# Patient Record
Sex: Female | Born: 1985
Health system: Southern US, Community
[De-identification: ages and names within clinical notes are randomized; demographics above are authoritative.]

## PROBLEM LIST (undated history)

## (undated) DIAGNOSIS — T8859XA Other complications of anesthesia, initial encounter: Secondary | ICD-10-CM

## (undated) DIAGNOSIS — I10 Essential (primary) hypertension: Secondary | ICD-10-CM

## (undated) DIAGNOSIS — K5900 Constipation, unspecified: Secondary | ICD-10-CM

## (undated) DIAGNOSIS — O24419 Gestational diabetes mellitus in pregnancy, unspecified control: Secondary | ICD-10-CM

## (undated) DIAGNOSIS — F419 Anxiety disorder, unspecified: Secondary | ICD-10-CM

## (undated) DIAGNOSIS — F329 Major depressive disorder, single episode, unspecified: Secondary | ICD-10-CM

## (undated) DIAGNOSIS — R87629 Unspecified abnormal cytological findings in specimens from vagina: Secondary | ICD-10-CM

## (undated) DIAGNOSIS — R58 Hemorrhage, not elsewhere classified: Secondary | ICD-10-CM

## (undated) DIAGNOSIS — F32A Depression, unspecified: Secondary | ICD-10-CM

## (undated) DIAGNOSIS — T4145XA Adverse effect of unspecified anesthetic, initial encounter: Secondary | ICD-10-CM

## (undated) DIAGNOSIS — O09299 Supervision of pregnancy with other poor reproductive or obstetric history, unspecified trimester: Secondary | ICD-10-CM

## (undated) HISTORY — DX: Depression, unspecified: F32.A

## (undated) HISTORY — DX: Anxiety disorder, unspecified: F41.9

## (undated) HISTORY — DX: Hemorrhage, not elsewhere classified: R58

## (undated) HISTORY — DX: Major depressive disorder, single episode, unspecified: F32.9

## (undated) HISTORY — DX: Essential (primary) hypertension: I10

## (undated) HISTORY — DX: Gestational diabetes mellitus in pregnancy, unspecified control: O24.419

## (undated) HISTORY — DX: Unspecified abnormal cytological findings in specimens from vagina: R87.629

## (undated) HISTORY — DX: Constipation, unspecified: K59.00

---
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
## 2004-11-11 HISTORY — PX: COLPOSCOPY: SHX161

## 2004-11-11 HISTORY — PX: LEEP: SHX91

## 2005-01-24 ENCOUNTER — Ambulatory Visit: Payer: Self-pay | Admitting: Family Medicine

## 2006-09-02 ENCOUNTER — Other Ambulatory Visit: Admission: RE | Admit: 2006-09-02 | Discharge: 2006-09-02 | Payer: Self-pay | Admitting: Unknown Physician Specialty

## 2006-12-06 ENCOUNTER — Emergency Department (HOSPITAL_COMMUNITY): Admission: EM | Admit: 2006-12-06 | Discharge: 2006-12-06 | Payer: Self-pay | Admitting: Emergency Medicine

## 2007-11-12 HISTORY — PX: WISDOM TOOTH EXTRACTION: SHX21

## 2011-03-03 DIAGNOSIS — O24419 Gestational diabetes mellitus in pregnancy, unspecified control: Secondary | ICD-10-CM

## 2014-07-29 DIAGNOSIS — O1490 Unspecified pre-eclampsia, unspecified trimester: Secondary | ICD-10-CM

## 2014-09-12 ENCOUNTER — Encounter: Payer: Self-pay | Admitting: *Deleted

## 2015-11-17 ENCOUNTER — Ambulatory Visit (INDEPENDENT_AMBULATORY_CARE_PROVIDER_SITE_OTHER): Payer: PRIVATE HEALTH INSURANCE | Admitting: Adult Health

## 2015-11-17 ENCOUNTER — Encounter: Payer: Self-pay | Admitting: Adult Health

## 2015-11-17 VITALS — BP 110/80 | HR 94 | Ht 62.0 in | Wt 163.5 lb

## 2015-11-17 DIAGNOSIS — Z3202 Encounter for pregnancy test, result negative: Secondary | ICD-10-CM | POA: Diagnosis not present

## 2015-11-17 DIAGNOSIS — R58 Hemorrhage, not elsewhere classified: Secondary | ICD-10-CM | POA: Diagnosis not present

## 2015-11-17 HISTORY — DX: Hemorrhage, not elsewhere classified: R58

## 2015-11-17 LAB — CBC
HEMATOCRIT: 39.4 % (ref 34.0–46.6)
HEMOGLOBIN: 13.7 g/dL (ref 11.1–15.9)
MCH: 30.6 pg (ref 26.6–33.0)
MCHC: 34.8 g/dL (ref 31.5–35.7)
MCV: 88 fL (ref 79–97)
Platelets: 273 10*3/uL (ref 150–379)
RBC: 4.48 x10E6/uL (ref 3.77–5.28)
RDW: 13.2 % (ref 12.3–15.4)
WBC: 7.6 10*3/uL (ref 3.4–10.8)

## 2015-11-17 LAB — POCT URINE PREGNANCY: Preg Test, Ur: NEGATIVE

## 2015-11-17 LAB — BETA HCG QUANT (REF LAB): HCG QUANT: 397 m[IU]/mL

## 2015-11-17 NOTE — Patient Instructions (Signed)
Can resume wellbutrin Will talk Monday

## 2015-11-17 NOTE — Progress Notes (Signed)
Subjective:     Patient ID: Tracey NatterJennifer R Reese, female   DOB: 02/15/1986, 30 y.o.   MRN: [id number]  HPI Tracey Reese is a 30 year old white female, married, G4P2 in for UPT, she had a + HPT 12/27 and had cramping Monday and then spotting Tuesday and pain in right side and then bleeding heavier yesterday, none now.NO pain or bleeding.Had miscarriage in August,has had 2 C sections. And history of LEEP.She stopped her Wellbutrin and xanax with +HPT.  Review of Systems Patient denies any headaches, hearing loss, fatigue, blurred vision, shortness of breath, chest pain, abdominal pain, problems with bowel movements, urination, or intercourse. No joint pain or mood swings.See HPI for positives. Reviewed past medical,surgical, social and family history. Reviewed medications and allergies.     Objective:   Physical Exam BP 110/80 mmHg  Pulse 94  Ht 5\' 2"  (1.575 m)  Wt 163 lb 8 oz (74.163 kg)  BMI 29.90 kg/m2  LMP 10/04/2015 UPT negative, Skin warm and dry. Neck: mid line trachea, normal thyroid, good ROM, no lymphadenopathy noted. Lungs: clear to ausculation bilaterally. Cardiovascular: regular rate and rhythm.Abdomen soft and non tender. Blood type A+ per pt, she has records with her will scan to system.     Assessment:    Bleeding    Plan:     Check QHCG stat and CBC Will talk when results back Can resume Wellbutrin now.

## 2015-11-20 ENCOUNTER — Telehealth: Payer: Self-pay | Admitting: Women's Health

## 2015-11-20 ENCOUNTER — Other Ambulatory Visit: Payer: Self-pay | Admitting: *Deleted

## 2015-11-20 ENCOUNTER — Telehealth: Payer: Self-pay | Admitting: *Deleted

## 2015-11-20 DIAGNOSIS — O4690 Antepartum hemorrhage, unspecified, unspecified trimester: Secondary | ICD-10-CM

## 2015-11-20 LAB — BETA HCG QUANT (REF LAB): hCG Quant: 96 m[IU]/mL

## 2015-11-20 NOTE — Telephone Encounter (Signed)
Opened encounter in error  

## 2015-11-20 NOTE — Telephone Encounter (Signed)
Pt aware QHCG 96 which is down from 397 Friday, this will be her 3rd miscarriage with same partner, will make appt to discuss with Dr Despina HiddenEure next week

## 2015-11-30 ENCOUNTER — Encounter: Payer: Self-pay | Admitting: Obstetrics & Gynecology

## 2015-11-30 ENCOUNTER — Ambulatory Visit (INDEPENDENT_AMBULATORY_CARE_PROVIDER_SITE_OTHER): Payer: PRIVATE HEALTH INSURANCE | Admitting: Obstetrics & Gynecology

## 2015-11-30 VITALS — BP 100/70 | HR 72 | Ht 62.0 in | Wt 163.3 lb

## 2015-11-30 DIAGNOSIS — N96 Recurrent pregnancy loss: Secondary | ICD-10-CM

## 2015-11-30 MED ORDER — PROGESTERONE MICRONIZED 200 MG PO CAPS: 200.0000 mg | ORAL_CAPSULE | Freq: Every day | ORAL | Status: DC

## 2015-11-30 NOTE — Progress Notes (Signed)
Patient ID: ICEL CASTLES, female   DOB: 1986-11-06, 30 y.o.   MRN: [id number]      Chief Complaint  Patient presents with  . Follow-up    serum hcg and talk about continue to have miscarriage.    Blood pressure 100/70, pulse 72, height  (1.575 m), weight 163 lb 4.8 oz (74.072 kg), last menstrual period 10/04/2015.  29 y.o. [id number] Patient's last menstrual period was 10/04/2015. The current method of family planning is none.  Subjective Reviewed history with pt and also took husband's history Has had 3 consecutive losses, all early  Objective   Pertinent ROS No burning with urination, frequency or urgency No nausea, vomiting or diarrhea Nor fever chills or other constitutional symptoms   Labs or studies Pending as below    Impression Diagnoses this Encounter::   ICD-9-CM ICD-10-CM   1. Recurrent pregnancy loss (CODE) [id number] Lupus anticoagulant panel     Cardiolipin antibodies, IgM+IgG     Beta-2 glycoprotein antibodies     Prolactin     TSH     HgB A1c    Established relevant diagnosis(es):   Plan/Recommendations: Meds ordered this encounter  Medications  . progesterone (PROMETRIUM) 200 MG capsule    Sig: Take 1 capsule (200 mg total) by mouth daily.    Dispense:  30 capsule    Refill:  11    Labs or Scans Ordered: Orders Placed This Encounter  Procedures  . Lupus anticoagulant panel  . Cardiolipin antibodies, IgM+IgG  . Beta-2 glycoprotein antibodies  . Prolactin  . TSH  . HgB A1c    Management::   Follow up 3 weeks or so based on pt schedule         Face to face time:  20 minutes  Greater than 50% of the visit time was spent in counseling and coordination of care with the patient.  The summary and outline of the counseling and care coordination is summarized in the note above.   All questions were answered.  Past Medical History  Diagnosis Date  . Gestational diabetes   . Hypertension     during pregnancy  .  Depression   . Anxiety   . Bleeding 11/17/2015  . Vaginal Pap smear, abnormal     Past Surgical History  Procedure Laterality Date  . Cesarean section  2012,2015  . Tonsillectomy and adenoidectomy  1989  . Colposcopy  2006  . Leep  2006  . Wisdom tooth extraction  2009    OB History    Gravida Para Term Preterm AB TAB SAB Ectopic Multiple Living   Allergies  Allergen Reactions  . Celexa [Citalopram] Other (See Comments)    Slurred speech; couldn't control body movements  . Sulfa Antibiotics Rash    Social History   Social History  . Marital Status: Single    Spouse Name: N/A  . Number of Children: N/A  . Years of Education: N/A   Social History Main Topics  . Smoking status: Former Smoker    Types: Cigarettes  . Smokeless tobacco: Former Neurosurgeon    Types: Chew     Comment: quit in 2011  . Alcohol Use: Yes     Comment: occ  . Drug Use: No  . Sexual Activity: Yes    Birth Control/ Protection: None   Other Topics Concern  . None   Social History Narrative  Family History  Problem Relation Age of Onset  . Cancer Mother     cervical  . Diabetes Mother     prediabetes  . Syncope episode Sister   . Hyperlipidemia Maternal Grandfather   . Other Maternal Grandfather     heart stents  . Parkinson's disease Paternal Grandmother   . Cancer Paternal Grandmother     colon

## 2015-12-12 LAB — CHROMOSOME, BLOOD, ROUTINE
CELLS ANALYZED: 20
Cells Counted: 20
Cells Karyotyped: 2
GTG Band Resolution Achieved: 500

## 2015-12-12 LAB — LUPUS ANTICOAGULANT PANEL
Dilute Viper Venom Time: 37.7 s (ref 0.0–44.0)
PTT Lupus Anticoagulant: 34.3 s (ref 0.0–40.6)

## 2015-12-12 LAB — TSH: TSH: 1.59 u[IU]/mL (ref 0.450–4.500)

## 2015-12-12 LAB — CARDIOLIPIN ANTIBODIES, IGM+IGG

## 2015-12-12 LAB — BETA-2-GLYCOPROTEIN I ABS, IGG/M/A: Beta-2 Glyco 1 IgM: <9 GPI IgM units (ref 0–32)

## 2015-12-12 LAB — HEMOGLOBIN A1C
Est. average glucose Bld gHb Est-mCnc: 103 mg/dL
HEMOGLOBIN A1C: 5.2 % (ref 4.8–5.6)

## 2015-12-12 LAB — PROLACTIN: PROLACTIN: 12.7 ng/mL (ref 4.8–23.3)

## 2017-03-14 ENCOUNTER — Emergency Department (HOSPITAL_COMMUNITY)
Admission: EM | Admit: 2017-03-14 | Discharge: 2017-03-14 | Disposition: A | Discharge status: Home / Self Care | Payer: Commercial Managed Care - PPO | Attending: Emergency Medicine | Admitting: Emergency Medicine

## 2017-03-14 ENCOUNTER — Emergency Department (HOSPITAL_COMMUNITY): Payer: Commercial Managed Care - PPO

## 2017-03-14 ENCOUNTER — Encounter (HOSPITAL_COMMUNITY): Payer: Self-pay | Admitting: *Deleted

## 2017-03-14 DIAGNOSIS — I880 Nonspecific mesenteric lymphadenitis: Secondary | ICD-10-CM | POA: Insufficient documentation

## 2017-03-14 DIAGNOSIS — Z79899 Other long term (current) drug therapy: Secondary | ICD-10-CM | POA: Diagnosis not present

## 2017-03-14 DIAGNOSIS — I10 Essential (primary) hypertension: Secondary | ICD-10-CM | POA: Diagnosis not present

## 2017-03-14 DIAGNOSIS — Z87891 Personal history of nicotine dependence: Secondary | ICD-10-CM | POA: Diagnosis not present

## 2017-03-14 DIAGNOSIS — R1031 Right lower quadrant pain: Secondary | ICD-10-CM | POA: Diagnosis present

## 2017-03-14 LAB — CBC
HCT: 43.7 % (ref 36.0–46.0)
HEMOGLOBIN: 14.9 g/dL (ref 12.0–15.0)
MCH: 30.1 pg (ref 26.0–34.0)
MCHC: 34.1 g/dL (ref 30.0–36.0)
MCV: 88.3 fL (ref 78.0–100.0)
PLATELETS: 249 10*3/uL (ref 150–400)
RBC: 4.95 MIL/uL (ref 3.87–5.11)
RDW: 12.5 % (ref 11.5–15.5)
WBC: 10.3 10*3/uL (ref 4.0–10.5)

## 2017-03-14 LAB — COMPREHENSIVE METABOLIC PANEL
ALK PHOS: 99 U/L (ref 38–126)
ALT: 27 U/L (ref 14–54)
ANION GAP: 9 (ref 5–15)
AST: 20 U/L (ref 15–41)
Albumin: 4.5 g/dL (ref 3.5–5.0)
BUN: 12 mg/dL (ref 6–20)
CALCIUM: 9.5 mg/dL (ref 8.9–10.3)
CO2: 26 mmol/L (ref 22–32)
CREATININE: 0.63 mg/dL (ref 0.44–1.00)
Chloride: 103 mmol/L (ref 101–111)
Glucose, Bld: 86 mg/dL (ref 65–99)
Potassium: 3.6 mmol/L (ref 3.5–5.1)
Sodium: 138 mmol/L (ref 135–145)
TOTAL PROTEIN: 7.9 g/dL (ref 6.5–8.1)
Total Bilirubin: 0.5 mg/dL (ref 0.3–1.2)

## 2017-03-14 LAB — URINALYSIS, ROUTINE W REFLEX MICROSCOPIC
BILIRUBIN URINE: NEGATIVE
Bacteria, UA: NONE SEEN
GLUCOSE, UA: NEGATIVE mg/dL
Ketones, ur: NEGATIVE mg/dL
Leukocytes, UA: NEGATIVE
NITRITE: NEGATIVE
Protein, ur: NEGATIVE mg/dL
SPECIFIC GRAVITY, URINE: 1.005 (ref 1.005–1.030)
pH: 6 (ref 5.0–8.0)

## 2017-03-14 LAB — I-STAT BETA HCG BLOOD, ED (MC, WL, AP ONLY)

## 2017-03-14 LAB — LIPASE, BLOOD: Lipase: 14 U/L (ref 11–51)

## 2017-03-14 MED ORDER — ONDANSETRON HCL 4 MG/2ML IJ SOLN
4.0000 mg | Freq: Once | INTRAMUSCULAR | Status: AC
  Administered 2017-03-14: 4 mg via INTRAVENOUS
  Filled 2017-03-14: qty 2

## 2017-03-14 MED ORDER — MORPHINE SULFATE (PF) 4 MG/ML IV SOLN
4.0000 mg | Freq: Once | INTRAVENOUS | Status: AC
  Administered 2017-03-14: 2 mg via INTRAVENOUS

## 2017-03-14 MED ORDER — ONDANSETRON 4 MG PO TBDP: ORAL_TABLET | ORAL | 0 refills | Status: DC

## 2017-03-14 MED ORDER — HYDROCODONE-ACETAMINOPHEN 5-325 MG PO TABS: 1.0000 | ORAL_TABLET | Freq: Four times a day (QID) | ORAL | 0 refills | Status: DC | PRN

## 2017-03-14 MED ORDER — HYDROMORPHONE HCL 1 MG/ML IJ SOLN
0.5000 mg | Freq: Once | INTRAMUSCULAR | Status: AC
  Administered 2017-03-14: 0.5 mg via INTRAVENOUS
  Filled 2017-03-14: qty 1

## 2017-03-14 MED ORDER — IOPAMIDOL (ISOVUE-300) INJECTION 61%
100.0000 mL | Freq: Once | INTRAVENOUS | Status: AC | PRN
  Administered 2017-03-14: 100 mL via INTRAVENOUS

## 2017-03-14 MED ORDER — IOPAMIDOL (ISOVUE-300) INJECTION 61%
INTRAVENOUS | Status: AC
  Administered 2017-03-14: 30 mL via ORAL
  Filled 2017-03-14: qty 30

## 2017-03-14 MED ORDER — HYDROMORPHONE HCL 1 MG/ML IJ SOLN: 1.0000 mg | Freq: Once | INTRAMUSCULAR | Status: DC

## 2017-03-14 MED ORDER — MORPHINE SULFATE (PF) 10 MG/ML IV SOLN
INTRAVENOUS | Status: AC
  Administered 2017-03-14: 2 mg
  Filled 2017-03-14: qty 1

## 2017-03-14 NOTE — ED Triage Notes (Signed)
Pt went to PCP's office and they referred her here.

## 2017-03-14 NOTE — ED Provider Notes (Signed)
[facility]ENCY DEPT Provider Note   CSN: [id number] Arrival date & time: 03/14/17  1448     History   Chief Complaint Chief Complaint  Patient presents with  . Abdominal Pain    HPI Tracey Reese is a 31 y.o. female.  Patient complains of right lower quadrant abdominal pain. Patient was seen by her family doctor sent her over here for rule out of appendicitis   The history is provided by the patient.  Abdominal Pain   This is a new problem. The current episode started 12 to 24 hours ago. The problem occurs constantly. The problem has not changed since onset.The pain is associated with an unknown factor. The pain is located in the RLQ. The quality of the pain is dull. The pain is at a severity of 7/10. The pain is moderate. Pertinent negatives include diarrhea, flatus, frequency, hematuria and headaches.    Past Medical History:  Diagnosis Date  . Anxiety   . Bleeding 11/17/2015  . Depression   . Gestational diabetes   . Hypertension    during pregnancy  . Vaginal Pap smear, abnormal     Patient Active Problem List   Diagnosis Date Noted  . Bleeding 11/17/2015    Past Surgical History:  Procedure Laterality Date  . CESAREAN SECTION  2012,2015  . COLPOSCOPY  2006  . LEEP  2006  . TONSILLECTOMY AND ADENOIDECTOMY  1989  . WISDOM TOOTH EXTRACTION  2009    OB History    Gravida Para Term Preterm AB Living   4 2     2 2    SAB TAB Ectopic Multiple Live Births   2               Home Medications    Prior to Admission medications   Medication Sig Start Date End Date Taking? Authorizing Provider  ALPRAZolam (XANAX) 0.25 MG tablet Take 0.125 mg by mouth at bedtime as needed for anxiety (takes 1/2 tab prn). Reported on 11/17/2015   Yes Historical Provider, MD  buPROPion (WELLBUTRIN SR) 150 MG 12 hr tablet Take 150 mg by mouth daily. Reported on 11/17/2015   Yes Historical Provider, MD  HYDROcodone-acetaminophen (NORCO/VICODIN) 5-325 MG tablet Take 1 tablet by mouth  every 6 (six) hours as needed for moderate pain. 03/14/17   Bethann Berkshire, MD  ondansetron (ZOFRAN ODT) 4 MG disintegrating tablet 4mg  ODT q4 hours prn nausea/vomit 03/14/17   Bethann Berkshire, MD    Family History Family History  Problem Relation Age of Onset  . Cancer Mother     cervical  . Diabetes Mother     prediabetes  . Syncope episode Sister   . Hyperlipidemia Maternal Grandfather   . Other Maternal Grandfather     heart stents  . Parkinson's disease Paternal Grandmother   . Cancer Paternal Grandmother     colon    Social History Social History  Substance Use Topics  . Smoking status: Former Smoker    Types: Cigarettes  . Smokeless tobacco: Former Neurosurgeon    Types: Chew     Comment: quit in 2011  . Alcohol use Yes     Comment: occ     Allergies   Celexa [citalopram] and Sulfa antibiotics   Review of Systems Review of Systems  Constitutional: Negative for appetite change and fatigue.  HENT: Negative for congestion, ear discharge and sinus pressure.   Eyes: Negative for discharge.  Respiratory: Negative for cough.   Cardiovascular: Negative for chest pain.  Gastrointestinal:  Positive for abdominal pain. Negative for diarrhea and flatus.  Genitourinary: Negative for frequency and hematuria.  Musculoskeletal: Negative for back pain.  Skin: Negative for rash.  Neurological: Negative for seizures and headaches.  Psychiatric/Behavioral: Negative for hallucinations.     Physical Exam Updated Vital Signs BP 109/73   Pulse 97   Temp 98.5 F (36.9 C) (Oral)   Resp 18   Ht 5\' 2"  (1.575 m)   Wt 153 lb (69.4 kg)   LMP 03/06/2017   SpO2 98%   BMI 27.98 kg/m   Physical Exam  Constitutional: She is oriented to person, place, and time. She appears well-developed.  HENT:  Head: Normocephalic.  Eyes: Conjunctivae and EOM are normal. No scleral icterus.  Neck: Neck supple. No thyromegaly present.  Cardiovascular: Normal rate and regular rhythm.  Exam reveals no  gallop and no friction rub.   No murmur heard. Pulmonary/Chest: No stridor. She has no wheezes. She has no rales. She exhibits no tenderness.  Abdominal: She exhibits no distension. There is tenderness. There is no rebound.  Moderate tender right lower quadrant  Musculoskeletal: Normal range of motion. She exhibits no edema.  Lymphadenopathy:    She has no cervical adenopathy.  Neurological: She is oriented to person, place, and time. She exhibits normal muscle tone. Coordination normal.  Skin: No rash noted. No erythema.  Psychiatric: She has a normal mood and affect. Her behavior is normal.     ED Treatments / Results  Labs (all labs ordered are listed, but only abnormal results are displayed) Labs Reviewed  URINALYSIS, ROUTINE W REFLEX MICROSCOPIC - Abnormal; Notable for the following:       Result Value   Color, Urine STRAW (*)    Hgb urine dipstick MODERATE (*)    Squamous Epithelial / LPF 0-5 (*)    All other components within normal limits  LIPASE, BLOOD  COMPREHENSIVE METABOLIC PANEL  CBC  I-STAT BETA HCG BLOOD, ED ([facility], [facility], [facility] ONLY)    EKG  EKG Interpretation None       Radiology Ct Abdomen Pelvis W Contrast  Result Date: 03/14/2017 CLINICAL DATA:  Right lower quadrant pain with vomiting EXAM: CT ABDOMEN AND PELVIS WITH CONTRAST TECHNIQUE: Multidetector CT imaging of the abdomen and pelvis was performed using the standard protocol following bolus administration of intravenous contrast. CONTRAST:  100mL ISOVUE-300 IOPAMIDOL (ISOVUE-300) INJECTION 61%, 30mL ISOVUE-300 IOPAMIDOL (ISOVUE-300) INJECTION 61% COMPARISON:  None. FINDINGS: Lower chest: No acute abnormality. Hepatobiliary: No focal liver abnormality is seen. No gallstones, gallbladder wall thickening, or biliary dilatation. Pancreas: Unremarkable. No pancreatic ductal dilatation or surrounding inflammatory changes. Spleen: Normal in size without focal abnormality. Adrenals/Urinary Tract: Adrenal glands are  unremarkable. Kidneys are normal, without renal calculi, focal lesion, or hydronephrosis. Bladder is unremarkable. Stomach/Bowel: Stomach is within normal limits. Appendix appears normal. No evidence of bowel wall thickening, distention, or inflammatory changes. Vascular/Lymphatic: Few nonspecific right lower quadrant mesenteric lymph nodes. Right external iliac node measuring 5 mm short axis. Reproductive: Uterus and bilateral adnexa are unremarkable. Other: Minimal fat in the umbilicus.  No free air or free fluid Musculoskeletal: No acute or significant osseous findings. IMPRESSION: 1. Negative for acute appendicitis 2. A few small right lower quadrant mesenteric nodes are questionable for an adenitis. Electronically Signed   By: Jasmine PangKim  Fujinaga M.D.   On: 03/14/2017 20:13    Procedures Procedures (including critical care time)  Medications Ordered in ED Medications  ondansetron (ZOFRAN) injection 4 mg (4 mg Intravenous Given 03/14/17 1613)  HYDROmorphone (DILAUDID) injection 0.5 mg (0.5 mg Intravenous Given 03/14/17 1613)  iopamidol (ISOVUE-300) 61 % injection (30 mLs Oral Contrast Given 03/14/17 1645)  morphine 4 MG/ML injection 4 mg (2 mg Intravenous Given 03/14/17 2023)  iopamidol (ISOVUE-300) 61 % injection 100 mL (100 mLs Intravenous Contrast Given 03/14/17 2004)  Morphine Sulfate (PF) 10 MG/ML SOLN (2 mg  Given 03/14/17 2018)     Initial Impression / Assessment and Plan / ED Course  I have reviewed the triage vital signs and the nursing notes.  Pertinent labs & imaging results that were available during my care of the patient were reviewed by me and considered in my medical decision making (see chart for details).     Labs unremarkable CT scan shows mesenteric adenitis. Patient will be treated with nausea and pain medicine and follow-up with PCP  Final Clinical Impressions(s) / ED Diagnoses   Final diagnoses:  Mesenteric adenitis    New Prescriptions New Prescriptions    HYDROCODONE-ACETAMINOPHEN (NORCO/VICODIN) 5-325 MG TABLET    Take 1 tablet by mouth every 6 (six) hours as needed for moderate pain.   ONDANSETRON (ZOFRAN ODT) 4 MG DISINTEGRATING TABLET    4mg  ODT q4 hours prn nausea/vomit     Bethann Berkshire, MD 03/14/17 2032

## 2017-03-14 NOTE — ED Triage Notes (Signed)
Pt comes in with RLQ pain starting 2 days ago. Pt began vomiting today (x4). Pt made a normal BM today.

## 2017-03-14 NOTE — ED Notes (Signed)
Patient only requested 2mg  of Morphine 2mg  wasted.

## 2017-03-14 NOTE — Discharge Instructions (Signed)
Drink plenty of fluids and follow up with your md next week for recheck 

## 2017-10-15 ENCOUNTER — Other Ambulatory Visit: Payer: Self-pay

## 2017-10-15 ENCOUNTER — Encounter: Payer: Self-pay | Admitting: Adult Health

## 2017-10-15 ENCOUNTER — Encounter (INDEPENDENT_AMBULATORY_CARE_PROVIDER_SITE_OTHER): Payer: Self-pay

## 2017-10-15 ENCOUNTER — Ambulatory Visit: Payer: PRIVATE HEALTH INSURANCE | Admitting: Adult Health

## 2017-10-15 VITALS — BP 120/82 | HR 93 | Ht 62.0 in | Wt 165.0 lb

## 2017-10-15 DIAGNOSIS — N96 Recurrent pregnancy loss: Secondary | ICD-10-CM | POA: Diagnosis not present

## 2017-10-15 DIAGNOSIS — Z3201 Encounter for pregnancy test, result positive: Secondary | ICD-10-CM | POA: Diagnosis not present

## 2017-10-15 DIAGNOSIS — N926 Irregular menstruation, unspecified: Secondary | ICD-10-CM | POA: Diagnosis not present

## 2017-10-15 DIAGNOSIS — Z3A01 Less than 8 weeks gestation of pregnancy: Secondary | ICD-10-CM

## 2017-10-15 DIAGNOSIS — Z302 Encounter for sterilization: Secondary | ICD-10-CM | POA: Insufficient documentation

## 2017-10-15 DIAGNOSIS — O3680X0 Pregnancy with inconclusive fetal viability, not applicable or unspecified: Secondary | ICD-10-CM

## 2017-10-15 LAB — POCT URINE PREGNANCY: PREG TEST UR: POSITIVE — AB

## 2017-10-15 NOTE — Patient Instructions (Signed)
First Trimester of Pregnancy The first trimester of pregnancy is from week 1 until the end of week 13 (months 1 through 3). A week after a sperm fertilizes an egg, the egg will implant on the wall of the uterus. This embryo will begin to develop into a baby. Genes from you and your partner will form the baby. The female genes will determine whether the baby will be a boy or a girl. At 6-8 weeks, the eyes and face will be formed, and the heartbeat can be seen on ultrasound. At the end of 12 weeks, all the baby's organs will be formed. Now that you are pregnant, you will want to do everything you can to have a healthy baby. Two of the most important things are to get good prenatal care and to follow your health care provider's instructions. Prenatal care is all the medical care you receive before the baby's birth. This care will help prevent, find, and treat any problems during the pregnancy and childbirth. Body changes during your first trimester Your body goes through many changes during pregnancy. The changes vary from woman to woman.  You may gain or lose a couple of pounds at first.  You may feel sick to your stomach (nauseous) and you may throw up (vomit). If the vomiting is uncontrollable, call your health care provider.  You may tire easily.  You may develop headaches that can be relieved by medicines. All medicines should be approved by your health care provider.  You may urinate more often. Painful urination may mean you have a bladder infection.  You may develop heartburn as a result of your pregnancy.  You may develop constipation because certain hormones are causing the muscles that push stool through your intestines to slow down.  You may develop hemorrhoids or swollen veins (varicose veins).  Your breasts may begin to grow larger and become tender. Your nipples may stick out more, and the tissue that surrounds them (areola) may become darker.  Your gums may bleed and may be  sensitive to brushing and flossing.  Dark spots or blotches (chloasma, mask of pregnancy) may develop on your face. This will likely fade after the baby is born.  Your menstrual periods will stop.  You may have a loss of appetite.  You may develop cravings for certain kinds of food.  You may have changes in your emotions from day to day, such as being excited to be pregnant or being concerned that something may go wrong with the pregnancy and baby.  You may have more vivid and strange dreams.  You may have changes in your hair. These can include thickening of your hair, rapid growth, and changes in texture. Some women also have hair loss during or after pregnancy, or hair that feels dry or thin. Your hair will most likely return to normal after your baby is born.  What to expect at prenatal visits During a routine prenatal visit:  You will be weighed to make sure you and the baby are growing normally.  Your blood pressure will be taken.  Your abdomen will be measured to track your baby's growth.  The fetal heartbeat will be listened to between weeks 10 and 14 of your pregnancy.  Test results from any previous visits will be discussed.  Your health care provider may ask you:  How you are feeling.  If you are feeling the baby move.  If you have had any abnormal symptoms, such as leaking fluid, bleeding, severe headaches,   or abdominal cramping.  If you are using any tobacco products, including cigarettes, chewing tobacco, and electronic cigarettes.  If you have any questions.  Other tests that may be performed during your first trimester include:  Blood tests to find your blood type and to check for the presence of any previous infections. The tests will also be used to check for low iron levels (anemia) and protein on red blood cells (Rh antibodies). Depending on your risk factors, or if you previously had diabetes during pregnancy, you may have tests to check for high blood  sugar that affects pregnant women (gestational diabetes).  Urine tests to check for infections, diabetes, or protein in the urine.  An ultrasound to confirm the proper growth and development of the baby.  Fetal screens for spinal cord problems (spina bifida) and Down syndrome.  HIV (human immunodeficiency virus) testing. Routine prenatal testing includes screening for HIV, unless you choose not to have this test.  You may need other tests to make sure you and the baby are doing well.  Follow these instructions at home: Medicines  Follow your health care provider's instructions regarding medicine use. Specific medicines may be either safe or unsafe to take during pregnancy.  Take a prenatal vitamin that contains at least 600 micrograms (mcg) of folic acid.  If you develop constipation, try taking a stool softener if your health care provider approves. Eating and drinking  Eat a balanced diet that includes fresh fruits and vegetables, whole grains, good sources of protein such as meat, eggs, or tofu, and low-fat dairy. Your health care provider will help you determine the amount of weight gain that is right for you.  Avoid raw meat and uncooked cheese. These carry germs that can cause birth defects in the baby.  Eating four or five small meals rather than three large meals a day may help relieve nausea and vomiting. If you start to feel nauseous, eating a few soda crackers can be helpful. Drinking liquids between meals, instead of during meals, also seems to help ease nausea and vomiting.  Limit foods that are high in fat and processed sugars, such as fried and sweet foods.  To prevent constipation: ? Eat foods that are high in fiber, such as fresh fruits and vegetables, whole grains, and beans. ? Drink enough fluid to keep your urine clear or pale yellow. Activity  Exercise only as directed by your health care provider. Most women can continue their usual exercise routine during  pregnancy. Try to exercise for 30 minutes at least 5 days a week. Exercising will help you: ? Control your weight. ? Stay in shape. ? Be prepared for labor and delivery.  Experiencing pain or cramping in the lower abdomen or lower back is a good sign that you should stop exercising. Check with your health care provider before continuing with normal exercises.  Try to avoid standing for long periods of time. Move your legs often if you must stand in one place for a long time.  Avoid heavy lifting.  Wear low-heeled shoes and practice good posture.  You may continue to have sex unless your health care provider tells you not to. Relieving pain and discomfort  Wear a good support bra to relieve breast tenderness.  Take warm sitz baths to soothe any pain or discomfort caused by hemorrhoids. Use hemorrhoid cream if your health care provider approves.  Rest with your legs elevated if you have leg cramps or low back pain.  If you develop   varicose veins in your legs, wear support hose. Elevate your feet for 15 minutes, 3-4 times a day. Limit salt in your diet. Prenatal care  Schedule your prenatal visits by the twelfth week of pregnancy. They are usually scheduled monthly at first, then more often in the last 2 months before delivery.  Write down your questions. Take them to your prenatal visits.  Keep all your prenatal visits as told by your health care provider. This is important. Safety  Wear your seat belt at all times when driving.  Make a list of emergency phone numbers, including numbers for family, friends, the hospital, and police and fire departments. General instructions  Ask your health care provider for a referral to a local prenatal education class. Begin classes no later than the beginning of month 6 of your pregnancy.  Ask for help if you have counseling or nutritional needs during pregnancy. Your health care provider can offer advice or refer you to specialists for help  with various needs.  Do not use hot tubs, steam rooms, or saunas.  Do not douche or use tampons or scented sanitary pads.  Do not cross your legs for long periods of time.  Avoid cat litter boxes and soil used by cats. These carry germs that can cause birth defects in the baby and possibly loss of the fetus by miscarriage or stillbirth.  Avoid all smoking, herbs, alcohol, and medicines not prescribed by your health care provider. Chemicals in these products affect the formation and growth of the baby.  Do not use any products that contain nicotine or tobacco, such as cigarettes and e-cigarettes. If you need help quitting, ask your health care provider. You may receive counseling support and other resources to help you quit.  Schedule a dentist appointment. At home, brush your teeth with a soft toothbrush and be gentle when you floss. Contact a health care provider if:  You have dizziness.  You have mild pelvic cramps, pelvic pressure, or nagging pain in the abdominal area.  You have persistent nausea, vomiting, or diarrhea.  You have a bad smelling vaginal discharge.  You have pain when you urinate.  You notice increased swelling in your face, hands, legs, or ankles.  You are exposed to fifth disease or chickenpox.  You are exposed to German measles (rubella) and have never had it. Get help right away if:  You have a fever.  You are leaking fluid from your vagina.  You have spotting or bleeding from your vagina.  You have severe abdominal cramping or pain.  You have rapid weight gain or loss.  You vomit blood or material that looks like coffee grounds.  You develop a severe headache.  You have shortness of breath.  You have any kind of trauma, such as from a fall or a car accident. Summary  The first trimester of pregnancy is from week 1 until the end of week 13 (months 1 through 3).  Your body goes through many changes during pregnancy. The changes vary from  woman to woman.  You will have routine prenatal visits. During those visits, your health care provider will examine you, discuss any test results you may have, and talk with you about how you are feeling. This information is not intended to replace advice given to you by your health care provider. Make sure you discuss any questions you have with your health care provider. Document Released: 10/22/2001 Document Revised: 10/09/2016 Document Reviewed: 10/09/2016 [employer] Interactive Patient Education  2017 [employer]  

## 2017-10-15 NOTE — Progress Notes (Signed)
Subjective:     Patient ID: Tracey Reese, female   DOB: 01/16/1986, 31 y.o.   MRN: [id number]  HPI Victorino DikeJennifer is a 31 year old white female, married in for UPT, has missed a period and has cramping L>R, no bleeding.Has had multiple miscarriages but has a boy and girl.   Review of Systems +missed period +cramping.no bleeding Reviewed past medical,surgical, social and family history. Reviewed medications and allergies.     Objective:   Physical Exam BP 120/82 (BP Location: Right Arm, Patient Position: Sitting, Cuff Size: Normal)   Pulse 93   Ht 5\' 2"  (1.575 m)   Wt 165 lb (74.8 kg)   LMP 08/30/2017   BMI 30.18 kg/m UPT +, about 6+4 weeks by LMP with EDD 06/06/18,Skin warm and dry. Neck: mid line trachea, normal thyroid, good ROM, no lymphadenopathy noted. Lungs: clear to ausculation bilaterally. Cardiovascular: regular rate and rhythm.Abdomen is soft and non tender. Blood type A+.    Assessment:     1. Positive pregnancy test   2. Less than [redacted] weeks gestation of pregnancy   3. Encounter to determine fetal viability of pregnancy, single or unspecified fetus   4. History of multiple miscarriages       Plan:    Push fluids  Check QHCG and progesterone Return in 1 week for dating US Try not to take xanax Can take wellbutrin Review handouts on first trimester and by [facility] Continue OTC PNV

## 2017-10-16 ENCOUNTER — Telehealth: Payer: Self-pay | Admitting: Adult Health

## 2017-10-16 LAB — BETA HCG QUANT (REF LAB): HCG QUANT: 35130 m[IU]/mL

## 2017-10-16 LAB — PROGESTERONE: Progesterone: 16.2 ng/mL

## 2017-10-16 NOTE — Telephone Encounter (Signed)
Pt aware that progesterone level is good and QHCG about 6-7 weeks, get US next week.

## 2017-10-23 ENCOUNTER — Ambulatory Visit (INDEPENDENT_AMBULATORY_CARE_PROVIDER_SITE_OTHER): Payer: Commercial Managed Care - PPO

## 2017-10-23 DIAGNOSIS — O3680X0 Pregnancy with inconclusive fetal viability, not applicable or unspecified: Secondary | ICD-10-CM | POA: Diagnosis not present

## 2017-10-23 DIAGNOSIS — Z3A01 Less than 8 weeks gestation of pregnancy: Secondary | ICD-10-CM

## 2017-10-23 NOTE — Progress Notes (Signed)
US 7+5 wks,single IUP w/ys,positive fht 162 bpm,normal ovaries bilat,crl 13.4 mm,EDD 06/06/2018 by LMP

## 2017-11-06 ENCOUNTER — Encounter: Payer: Commercial Managed Care - PPO | Admitting: Advanced Practice Midwife

## 2017-11-06 ENCOUNTER — Encounter: Payer: Self-pay | Admitting: Women's Health

## 2017-11-06 ENCOUNTER — Ambulatory Visit (INDEPENDENT_AMBULATORY_CARE_PROVIDER_SITE_OTHER): Payer: Commercial Managed Care - PPO | Admitting: Women's Health

## 2017-11-06 ENCOUNTER — Encounter: Payer: Commercial Managed Care - PPO | Admitting: *Deleted

## 2017-11-06 VITALS — BP 118/80 | HR 76 | Wt 163.0 lb

## 2017-11-06 DIAGNOSIS — O09291 Supervision of pregnancy with other poor reproductive or obstetric history, first trimester: Secondary | ICD-10-CM

## 2017-11-06 DIAGNOSIS — Z331 Pregnant state, incidental: Secondary | ICD-10-CM

## 2017-11-06 DIAGNOSIS — Z3A09 9 weeks gestation of pregnancy: Secondary | ICD-10-CM

## 2017-11-06 DIAGNOSIS — Z3481 Encounter for supervision of other normal pregnancy, first trimester: Secondary | ICD-10-CM

## 2017-11-06 DIAGNOSIS — O34219 Maternal care for unspecified type scar from previous cesarean delivery: Secondary | ICD-10-CM

## 2017-11-06 DIAGNOSIS — Z9889 Other specified postprocedural states: Secondary | ICD-10-CM | POA: Insufficient documentation

## 2017-11-06 DIAGNOSIS — Z8632 Personal history of gestational diabetes: Secondary | ICD-10-CM

## 2017-11-06 DIAGNOSIS — F418 Other specified anxiety disorders: Secondary | ICD-10-CM | POA: Insufficient documentation

## 2017-11-06 DIAGNOSIS — Z3490 Encounter for supervision of normal pregnancy, unspecified, unspecified trimester: Secondary | ICD-10-CM | POA: Insufficient documentation

## 2017-11-06 DIAGNOSIS — O99341 Other mental disorders complicating pregnancy, first trimester: Secondary | ICD-10-CM

## 2017-11-06 DIAGNOSIS — Z1389 Encounter for screening for other disorder: Secondary | ICD-10-CM

## 2017-11-06 DIAGNOSIS — Z98891 History of uterine scar from previous surgery: Secondary | ICD-10-CM | POA: Insufficient documentation

## 2017-11-06 DIAGNOSIS — O2621 Pregnancy care for patient with recurrent pregnancy loss, first trimester: Secondary | ICD-10-CM

## 2017-11-06 DIAGNOSIS — O09299 Supervision of pregnancy with other poor reproductive or obstetric history, unspecified trimester: Secondary | ICD-10-CM | POA: Insufficient documentation

## 2017-11-06 DIAGNOSIS — Z3682 Encounter for antenatal screening for nuchal translucency: Secondary | ICD-10-CM

## 2017-11-06 LAB — POCT URINALYSIS DIPSTICK
Blood, UA: NEGATIVE
Glucose, UA: NEGATIVE
KETONES UA: NEGATIVE
LEUKOCYTES UA: NEGATIVE
NITRITE UA: NEGATIVE
PROTEIN UA: NEGATIVE

## 2017-11-06 NOTE — Progress Notes (Signed)
INITIAL OBSTETRICAL VISIT Patient name: Tracey Reese Brandstetter MRN [id number]  Date of birth: 08/01/1986 Chief Complaint:   Initial Prenatal Visit (cramping )  History of Present Illness:   Tracey Reese Harth is a 31 y.o. [id number] Caucasian female at 6130w5d by LMP c/w 7wk u/s, with an Estimated Date of Delivery: 06/06/18 being seen today for her initial obstetrical visit.   Her obstetrical history is significant for c/s x 2, desires repeat. A1DM 1st pregnancy, pre-e 2nd pregnancy. SAB x 4.  Wants BTL. H/O dep/anx, doing well on wellbutrin. Xanax prior to pregnancy.  Today she reports mild cramping and constipation.  Patient's last menstrual period was 08/30/2017. Last pap due- but doesn't want to do today. Results were: normal. Does have h/o abnormal pap w/ LEEP 2006 Review of Systems:   Pertinent items are noted in HPI Denies cramping/contractions, leakage of fluid, vaginal bleeding, abnormal vaginal discharge w/ itching/odor/irritation, headaches, visual changes, shortness of breath, chest pain, abdominal pain, severe nausea/vomiting, or problems with urination or bowel movements unless otherwise stated above.  Pertinent History Reviewed:  Reviewed past medical,surgical, social, obstetrical and family history.  Reviewed problem list, medications and allergies. OB History  Gravida Para Term Preterm AB Living  7 2 2   4 2   SAB TAB Ectopic Multiple Live Births  4       2    # Outcome Date GA Lbr Len/2nd Weight Sex Delivery Anes PTL Lv  7 Current           6 SAB 2017          5 SAB 2016          4 SAB 2016          3 Term 07/29/14 1347w4d  6 lb 1 oz (2.75 kg) F CS-LTranv EPI N LIV     Complications: Preeclampsia  2 SAB 08/2013          1 Term 03/03/11 1191w0d  8 lb 6 oz (3.799 kg) M CS-LTranv EPI N LIV     Complications: Failure to Progress in Second Stage,Gestational diabetes     Physical Assessment:   Vitals:   11/06/17 1005  BP: 118/80  Pulse: 76  Weight: 163 lb (73.9 kg)  Body mass  index is 29.81 kg/m.       Physical Examination:  General appearance - well appearing, and in no distress  Mental status - alert, oriented to person, place, and time  Psych:  She has a normal mood and affect  Skin - warm and dry, normal color, no suspicious lesions noted  Chest - effort normal, all lung fields clear to auscultation bilaterally  Heart - normal rate and regular rhythm  Abdomen - soft, nontender  Extremities:  No swelling or varicosities noted  Thin prep pap is not done   Fetal Heart Rate (bpm): + us via informal transabdominal u/s, +active fetus  Results for orders placed or performed in visit on 11/06/17 (from the past 24 hour(s))  POCT urinalysis dipstick   Collection Time: 11/06/17 10:43 AM  Result Value Ref Range   Color, UA     Clarity, UA     Glucose, UA neg    Bilirubin, UA     Ketones, UA neg    Spec Grav, UA  1.010 - 1.025   Blood, UA neg    pH, UA  5.0 - 8.0   Protein, UA neg    Urobilinogen, UA  0.2 or 1.0 E.U./dL  Nitrite, UA neg    Leukocytes, UA Negative Negative   Appearance     Odor      Assessment & Plan:  1) Low-Risk Pregnancy [id number] at 1234w5d with an Estimated Date of Delivery: 06/06/18   2) Initial OB visit  3) Prev c/s x 2>wants RCS w/ BTL  4) H/O A1DM 1st pregnancy> will get early 2hr GTT  5) H/O pre-e 2nd pregnancy> start baby asa daily @ 12wks, will get baseline pre-e labs  6) H/O multiple SABs> checked progesterone at preg test visit, normal  7) Dep/anx> continue wellbutrin, continue cessation of xanax  8) Constipation> gave printed prevention/relief measures   Initial labs obtained Continue prenatal vitamins Reviewed n/v relief measures and warning s/s to report Reviewed recommended weight gain based on pre-gravid BMI Encouraged well-balanced diet Genetic Screening discussed Integrated Screen: requested Cystic fibrosis screening discussed declined Ultrasound discussed; fetal survey: requested CCNC completed>not sent,  not applying for preg mcaid  Follow-up: Return for asap for early sugar test (no visit), then 4wks for LROB and 1st IT/NT.   Orders Placed This Encounter  Procedures  . GC/Chlamydia Probe Amp  . Urine Culture  . US Fetal Nuchal Translucency Measurement  . Obstetric Panel, Including HIV  . Urinalysis, Routine w reflex microscopic  . Comprehensive metabolic panel  . Protein / creatinine ratio, urine  . POCT urinalysis dipstick    Marge DuncansBooker, Tank Difiore Randall CNM, [facility]NP[facility]BC 11/06/2017 11:24 AM

## 2017-11-06 NOTE — Patient Instructions (Addendum)
Tracey Reese, I greatly value your feedback.  If you receive a survey following your visit with [place] today, we appreciate you taking the time to fill it out.  Thanks, Joellyn Haff, CNM, WHNP-BC  Begin taking a 81mg  baby aspirin daily at 12 weeks of pregnancy (1/12) to decrease risk of preeclampsia during pregnancy   You will have your sugar test next visit.  Please do not eat or drink anything after midnight the night before you come, not even water.  You will be here for at least two hours.      Nausea & Vomiting  Have saltine crackers or pretzels by your bed and eat a few bites before you raise your head out of bed in the morning  Eat small frequent meals throughout the day instead of large meals  Drink plenty of fluids throughout the day to stay hydrated, just don't drink a lot of fluids with your meals.  This can make your stomach fill up faster making you feel sick  Do not brush your teeth right after you eat  Products with real ginger are good for nausea, like ginger ale and ginger hard candy Make sure it says made with real ginger!  Sucking on sour candy like lemon heads is also good for nausea  If your prenatal vitamins make you nauseated, take them at night so you will sleep through the nausea  Sea Bands  If you feel like you need medicine for the nausea & vomiting please let us know  If you are unable to keep any fluids or food down please let us know   Constipation  Drink plenty of fluid, preferably water, throughout the day  Eat foods high in fiber such as fruits, vegetables, and grains  Exercise, such as walking, is a good way to keep your bowels regular  Drink warm fluids, especially warm prune juice, or decaf coffee  Eat a 1/2 cup of real oatmeal (not instant), 1/2 cup applesauce, and 1/2-1 cup warm prune juice every day  If needed, you may take Colace (docusate sodium) stool softener once or twice a day to help keep the stool soft. If you are pregnant, wait  until you are out of your first trimester (12-14 weeks of pregnancy)  If you still are having problems with constipation, you may take Miralax once daily as needed to help keep your bowels regular.  If you are pregnant, wait until you are out of your first trimester (12-14 weeks of pregnancy)   First Trimester of Pregnancy The first trimester of pregnancy is from week 1 until the end of week 12 (months 1 through 3). A week after a sperm fertilizes an egg, the egg will implant on the wall of the uterus. This embryo will begin to develop into a baby. Genes from you and your partner are forming the baby. The female genes determine whether the baby is a boy or a girl. At 6-8 weeks, the eyes and face are formed, and the heartbeat can be seen on ultrasound. At the end of 12 weeks, all the baby's organs are formed.  Now that you are pregnant, you will want to do everything you can to have a healthy baby. Two of the most important things are to get good prenatal care and to follow your health care provider's instructions. Prenatal care is all the medical care you receive before the baby's birth. This care will help prevent, find, and treat any problems during the pregnancy and childbirth. BODY  CHANGES Your body goes through many changes during pregnancy. The changes vary from woman to woman.   You may gain or lose a couple of pounds at first.  You may feel sick to your stomach (nauseous) and throw up (vomit). If the vomiting is uncontrollable, call your health care provider.  You may tire easily.  You may develop headaches that can be relieved by medicines approved by your health care provider.  You may urinate more often. Painful urination may mean you have a bladder infection.  You may develop heartburn as a result of your pregnancy.  You may develop constipation because certain hormones are causing the muscles that push waste through your intestines to slow down.  You may develop hemorrhoids or  swollen, bulging veins (varicose veins).  Your breasts may begin to grow larger and become tender. Your nipples may stick out more, and the tissue that surrounds them (areola) may become darker.  Your gums may bleed and may be sensitive to brushing and flossing.  Dark spots or blotches (chloasma, mask of pregnancy) may develop on your face. This will likely fade after the baby is born.  Your menstrual periods will stop.  You may have a loss of appetite.  You may develop cravings for certain kinds of food.  You may have changes in your emotions from day to day, such as being excited to be pregnant or being concerned that something may go wrong with the pregnancy and baby.  You may have more vivid and strange dreams.  You may have changes in your hair. These can include thickening of your hair, rapid growth, and changes in texture. Some women also have hair loss during or after pregnancy, or hair that feels dry or thin. Your hair will most likely return to normal after your baby is born. WHAT TO EXPECT AT YOUR PRENATAL VISITS During a routine prenatal visit:  You will be weighed to make sure you and the baby are growing normally.  Your blood pressure will be taken.  Your abdomen will be measured to track your baby's growth.  The fetal heartbeat will be listened to starting around week 10 or 12 of your pregnancy.  Test results from any previous visits will be discussed. Your health care provider may ask you:  How you are feeling.  If you are feeling the baby move.  If you have had any abnormal symptoms, such as leaking fluid, bleeding, severe headaches, or abdominal cramping.  If you have any questions. Other tests that may be performed during your first trimester include:  Blood tests to find your blood type and to check for the presence of any previous infections. They will also be used to check for low iron levels (anemia) and Rh antibodies. Later in the pregnancy, blood  tests for diabetes will be done along with other tests if problems develop.  Urine tests to check for infections, diabetes, or protein in the urine.  An ultrasound to confirm the proper growth and development of the baby.  An amniocentesis to check for possible genetic problems.  Fetal screens for spina bifida and Down syndrome.  You may need other tests to make sure you and the baby are doing well. HOME CARE INSTRUCTIONS  Medicines  Follow your health care provider's instructions regarding medicine use. Specific medicines may be either safe or unsafe to take during pregnancy.  Take your prenatal vitamins as directed.  If you develop constipation, try taking a stool softener if your health care provider  approves. Diet  Eat regular, well-balanced meals. Choose a variety of foods, such as meat or vegetable-based protein, fish, milk and low-fat dairy products, vegetables, fruits, and whole grain breads and cereals. Your health care provider will help you determine the amount of weight gain that is right for you.  Avoid raw meat and uncooked cheese. These carry germs that can cause birth defects in the baby.  Eating four or five small meals rather than three large meals a day may help relieve nausea and vomiting. If you start to feel nauseous, eating a few soda crackers can be helpful. Drinking liquids between meals instead of during meals also seems to help nausea and vomiting.  If you develop constipation, eat more high-fiber foods, such as fresh vegetables or fruit and whole grains. Drink enough fluids to keep your urine clear or pale yellow. Activity and Exercise  Exercise only as directed by your health care provider. Exercising will help you:  Control your weight.  Stay in shape.  Be prepared for labor and delivery.  Experiencing pain or cramping in the lower abdomen or low back is a good sign that you should stop exercising. Check with your health care provider before  continuing normal exercises.  Try to avoid standing for long periods of time. Move your legs often if you must stand in one place for a long time.  Avoid heavy lifting.  Wear low-heeled shoes, and practice good posture.  You may continue to have sex unless your health care provider directs you otherwise. Relief of Pain or Discomfort  Wear a good support bra for breast tenderness.   Take warm sitz baths to soothe any pain or discomfort caused by hemorrhoids. Use hemorrhoid cream if your health care provider approves.   Rest with your legs elevated if you have leg cramps or low back pain.  If you develop varicose veins in your legs, wear support hose. Elevate your feet for 15 minutes, 3-4 times a day. Limit salt in your diet. Prenatal Care  Schedule your prenatal visits by the twelfth week of pregnancy. They are usually scheduled monthly at first, then more often in the last 2 months before delivery.  Write down your questions. Take them to your prenatal visits.  Keep all your prenatal visits as directed by your health care provider. Safety  Wear your seat belt at all times when driving.  Make a list of emergency phone numbers, including numbers for family, friends, the hospital, and police and fire departments. General Tips  Ask your health care provider for a referral to a local prenatal education class. Begin classes no later than at the beginning of month 6 of your pregnancy.  Ask for help if you have counseling or nutritional needs during pregnancy. Your health care provider can offer advice or refer you to specialists for help with various needs.  Do not use hot tubs, steam rooms, or saunas.  Do not douche or use tampons or scented sanitary pads.  Do not cross your legs for long periods of time.  Avoid cat litter boxes and soil used by cats. These carry germs that can cause birth defects in the baby and possibly loss of the fetus by miscarriage or stillbirth.  Avoid  all smoking, herbs, alcohol, and medicines not prescribed by your health care provider. Chemicals in these affect the formation and growth of the baby.  Schedule a dentist appointment. At home, brush your teeth with a soft toothbrush and be gentle when you floss. SEEK  MEDICAL CARE IF:   You have dizziness.  You have mild pelvic cramps, pelvic pressure, or nagging pain in the abdominal area.  You have persistent nausea, vomiting, or diarrhea.  You have a bad smelling vaginal discharge.  You have pain with urination.  You notice increased swelling in your face, hands, legs, or ankles. SEEK IMMEDIATE MEDICAL CARE IF:   You have a fever.  You are leaking fluid from your vagina.  You have spotting or bleeding from your vagina.  You have severe abdominal cramping or pain.  You have rapid weight gain or loss.  You vomit blood or material that looks like coffee grounds.  You are exposed to [place] measles and have never had them.  You are exposed to fifth disease or chickenpox.  You develop a severe headache.  You have shortness of breath.  You have any kind of trauma, such as from a fall or a car accident. Document Released: 10/22/2001 Document Revised: 03/14/2014 Document Reviewed: 09/07/2013 [facility] Patient Information 2015 [place], [place]. This information is not intended to replace advice given to you by your health care provider. Make sure you discuss any questions you have with your health care provider.

## 2017-11-07 LAB — OBSTETRIC PANEL, INCLUDING HIV
ANTIBODY SCREEN: NEGATIVE
BASOS ABS: 0 10*3/uL (ref 0.0–0.2)
BASOS: 0 %
EOS (ABSOLUTE): 0.1 10*3/uL (ref 0.0–0.4)
Eos: 1 %
HEMATOCRIT: 39.5 % (ref 34.0–46.6)
HEMOGLOBIN: 12.9 g/dL (ref 11.1–15.9)
HIV Screen 4th Generation wRfx: NONREACTIVE
Hepatitis B Surface Ag: NEGATIVE
IMMATURE GRANS (ABS): 0 10*3/uL (ref 0.0–0.1)
Immature Granulocytes: 0 %
LYMPHS: 19 %
Lymphocytes Absolute: 1.9 10*3/uL (ref 0.7–3.1)
MCH: 29.5 pg (ref 26.6–33.0)
MCHC: 32.7 g/dL (ref 31.5–35.7)
MCV: 90 fL (ref 79–97)
MONOCYTES: 6 %
Monocytes Absolute: 0.5 10*3/uL (ref 0.1–0.9)
Neutrophils Absolute: 7.3 10*3/uL — ABNORMAL HIGH (ref 1.4–7.0)
Neutrophils: 74 %
Platelets: 267 10*3/uL (ref 150–379)
RBC: 4.37 x10E6/uL (ref 3.77–5.28)
RDW: 13.3 % (ref 12.3–15.4)
RPR Ser Ql: NONREACTIVE
RUBELLA: 1.75 {index} (ref >0.99)
Rh Factor: POSITIVE
WBC: 9.9 10*3/uL (ref 3.4–10.8)

## 2017-11-07 LAB — PMP SCREEN PROFILE (10S), URINE
Amphetamine Scrn, Ur: NEGATIVE ng/mL
BARBITURATE SCREEN URINE: NEGATIVE ng/mL
BENZODIAZEPINE SCREEN, URINE: NEGATIVE ng/mL
CANNABINOIDS UR QL SCN: NEGATIVE ng/mL
COCAINE(METAB.)SCREEN, URINE: NEGATIVE ng/mL
CREATININE(CRT), U: 125 mg/dL (ref 20.0–300.0)
METHADONE SCREEN, URINE: NEGATIVE ng/mL
OPIATE SCREEN URINE: NEGATIVE ng/mL
OXYCODONE+OXYMORPHONE UR QL SCN: NEGATIVE ng/mL
PROPOXYPHENE SCREEN URINE: NEGATIVE ng/mL
Ph of Urine: 5.6 (ref 4.5–8.9)
Phencyclidine Qn, Ur: NEGATIVE ng/mL

## 2017-11-07 LAB — COMPREHENSIVE METABOLIC PANEL
ALT: 16 IU/L (ref 0–32)
AST: 13 IU/L (ref 0–40)
Albumin/Globulin Ratio: 1.6 (ref 1.2–2.2)
Albumin: 4.4 g/dL (ref 3.5–5.5)
Alkaline Phosphatase: 81 IU/L (ref 39–117)
BUN/Creatinine Ratio: 11 (ref 9–23)
BUN: 7 mg/dL (ref 6–20)
Bilirubin Total: <0.2 mg/dL (ref 0.0–1.2)
CALCIUM: 9.2 mg/dL (ref 8.7–10.2)
CO2: 22 mmol/L (ref 20–29)
CREATININE: 0.61 mg/dL (ref 0.57–1.00)
Chloride: 103 mmol/L (ref 96–106)
GFR calc Af Amer: 139 mL/min/{1.73_m2} (ref >59)
GFR, EST NON AFRICAN AMERICAN: 120 mL/min/{1.73_m2} (ref >59)
GLUCOSE: 80 mg/dL (ref 65–99)
Globulin, Total: 2.7 g/dL (ref 1.5–4.5)
Potassium: 4 mmol/L (ref 3.5–5.2)
Sodium: 139 mmol/L (ref 134–144)
Total Protein: 7.1 g/dL (ref 6.0–8.5)

## 2017-11-07 LAB — URINALYSIS, ROUTINE W REFLEX MICROSCOPIC
BILIRUBIN UA: NEGATIVE
Glucose, UA: NEGATIVE
KETONES UA: NEGATIVE
Leukocytes, UA: NEGATIVE
NITRITE UA: NEGATIVE
Protein, UA: NEGATIVE
RBC UA: NEGATIVE
SPEC GRAV UA: 1.011 (ref 1.005–1.030)
UUROB: 0.2 mg/dL (ref 0.2–1.0)
pH, UA: 6.5 (ref 5.0–7.5)

## 2017-11-07 LAB — PROTEIN / CREATININE RATIO, URINE: CREATININE, UR: 37.5 mg/dL

## 2017-11-08 LAB — GC/CHLAMYDIA PROBE AMP
Chlamydia trachomatis, NAA: NEGATIVE
Neisseria gonorrhoeae by PCR: NEGATIVE

## 2017-11-08 LAB — URINE CULTURE: ORGANISM ID, BACTERIA: NO GROWTH

## 2017-11-10 ENCOUNTER — Other Ambulatory Visit: Payer: Commercial Managed Care - PPO

## 2017-11-10 DIAGNOSIS — Z8632 Personal history of gestational diabetes: Principal | ICD-10-CM

## 2017-11-10 DIAGNOSIS — O09299 Supervision of pregnancy with other poor reproductive or obstetric history, unspecified trimester: Secondary | ICD-10-CM

## 2017-11-11 LAB — GLUCOSE TOLERANCE, 2 HOURS W/ 1HR
Glucose, 1 hour: 72 mg/dL (ref 65–179)
Glucose, 2 hour: 75 mg/dL (ref 65–152)
Glucose, Fasting: 69 mg/dL (ref 65–91)

## 2017-12-04 ENCOUNTER — Encounter: Payer: Self-pay | Admitting: Women's Health

## 2017-12-04 ENCOUNTER — Other Ambulatory Visit (HOSPITAL_COMMUNITY)
Admission: RE | Admit: 2017-12-04 | Discharge: 2017-12-04 | Disposition: A | Discharge status: Home / Self Care | Payer: Commercial Managed Care - PPO | Source: Ambulatory Visit | Attending: Advanced Practice Midwife | Admitting: Advanced Practice Midwife

## 2017-12-04 ENCOUNTER — Ambulatory Visit (INDEPENDENT_AMBULATORY_CARE_PROVIDER_SITE_OTHER): Payer: Commercial Managed Care - PPO | Admitting: Women's Health

## 2017-12-04 ENCOUNTER — Ambulatory Visit (INDEPENDENT_AMBULATORY_CARE_PROVIDER_SITE_OTHER): Payer: Commercial Managed Care - PPO

## 2017-12-04 VITALS — BP 112/80 | HR 72 | Wt 165.5 lb

## 2017-12-04 DIAGNOSIS — Z3481 Encounter for supervision of other normal pregnancy, first trimester: Secondary | ICD-10-CM

## 2017-12-04 DIAGNOSIS — O09291 Supervision of pregnancy with other poor reproductive or obstetric history, first trimester: Secondary | ICD-10-CM

## 2017-12-04 DIAGNOSIS — Z331 Pregnant state, incidental: Secondary | ICD-10-CM

## 2017-12-04 DIAGNOSIS — Z363 Encounter for antenatal screening for malformations: Secondary | ICD-10-CM

## 2017-12-04 DIAGNOSIS — Z3A13 13 weeks gestation of pregnancy: Secondary | ICD-10-CM

## 2017-12-04 DIAGNOSIS — Z3682 Encounter for antenatal screening for nuchal translucency: Secondary | ICD-10-CM | POA: Diagnosis not present

## 2017-12-04 DIAGNOSIS — O9989 Other specified diseases and conditions complicating pregnancy, childbirth and the puerperium: Secondary | ICD-10-CM

## 2017-12-04 DIAGNOSIS — O09299 Supervision of pregnancy with other poor reproductive or obstetric history, unspecified trimester: Secondary | ICD-10-CM

## 2017-12-04 DIAGNOSIS — Z1389 Encounter for screening for other disorder: Secondary | ICD-10-CM

## 2017-12-04 DIAGNOSIS — I8393 Asymptomatic varicose veins of bilateral lower extremities: Secondary | ICD-10-CM

## 2017-12-04 DIAGNOSIS — I8390 Asymptomatic varicose veins of unspecified lower extremity: Secondary | ICD-10-CM | POA: Insufficient documentation

## 2017-12-04 LAB — POCT URINALYSIS DIPSTICK
Blood, UA: NEGATIVE
Glucose, UA: NEGATIVE
Ketones, UA: NEGATIVE
Leukocytes, UA: NEGATIVE
NITRITE UA: NEGATIVE
PROTEIN UA: NEGATIVE

## 2017-12-04 NOTE — Progress Notes (Signed)
US 13+5 wks,measurements c/w dates,NT 1.5 mm,NB present,crl 82.03 mm,normal ovaries bilat,posterior pl gr 0

## 2017-12-04 NOTE — Progress Notes (Signed)
   LOW-RISK PREGNANCY VISIT Patient name: Tracey NatterJennifer R Reese MRN [id number]  Date of birth: 04/19/1986 Chief Complaint:   Routine Prenatal Visit (1st IT; pap today)  History of Present Illness:   Tracey Reese is a 32 y.o. [id number] female at 4019w5d with an Estimated Date of Delivery: 06/06/18 being seen today for ongoing management of a low-risk pregnancy.  Today she reports varicose veins both legs, Rt>Lt.  . Vag. Bleeding: None.   . denies leaking of fluid. Review of Systems:   Pertinent items are noted in HPI Denies abnormal vaginal discharge w/ itching/odor/irritation, headaches, visual changes, shortness of breath, chest pain, abdominal pain, severe nausea/vomiting, or problems with urination or bowel movements unless otherwise stated above. Pertinent History Reviewed:  Reviewed past medical,surgical, social, obstetrical and family history.  Reviewed problem list, medications and allergies. Physical Assessment:   Vitals:   12/04/17 1342  BP: 112/80  Pulse: 72  Weight: 165 lb 8 oz (75.1 kg)  Body mass index is 30.27 kg/m.        Physical Examination:   General appearance: Well appearing, and in no distress  Mental status: Alert, oriented to person, place, and time  Skin: Warm & dry  Cardiovascular: Normal heart rate noted  Respiratory: Normal respiratory effort, no distress  Abdomen: Soft, gravid, nontender  Pelvic: thin prep pap obtained, ?cervical polyp- JVF for co-exam, eversion from previous LEEP         Extremities: Edema: Trace varicose veins bilateral, Rt >Lt  Fetal Status:           US 13+5 wks,measurements c/w dates,NT 1.5 mm,NB present,crl 82.03 mm,normal ovaries bilat,posterior pl gr 0  Results for orders placed or performed in visit on 12/04/17 (from the past 24 hour(s))  POCT urinalysis dipstick   Collection Time: 12/04/17  1:44 PM  Result Value Ref Range   Color, UA     Clarity, UA     Glucose, UA neg    Bilirubin, UA     Ketones, UA neg    Spec Grav,  UA  1.010 - 1.025   Blood, UA neg    pH, UA  5.0 - 8.0   Protein, UA neg    Urobilinogen, UA  0.2 or 1.0 E.U./dL   Nitrite, UA neg    Leukocytes, UA Negative Negative   Appearance     Odor      Assessment & Plan:  1) Low-risk pregnancy [id number] at 4119w5d with an Estimated Date of Delivery: 06/06/18   2) Varicose veins legs, rx bilateral thigh-high compression hose 20-230mmHg   Meds: No orders of the defined types were placed in this encounter.  Labs/procedures today: 1st IT, pap  Plan:  Continue routine obstetrical care   Reviewed: Preterm labor symptoms and general obstetric precautions including but not limited to vaginal bleeding, contractions, leaking of fluid and fetal movement were reviewed in detail with the patient.  All questions were answered  Follow-up: Return in about 5 weeks (around 01/05/2018) for LROB, AO:[id number]:Anatomy, 2nd IT.  Orders Placed This Encounter  Procedures  . US OB Comp + 14 Wk  . Integrated 1  . POCT urinalysis dipstick   Marge DuncansBooker, Tramain Gershman Randall CNM, [facility]NP-BC 12/04/2017 2:15 PM

## 2017-12-04 NOTE — Patient Instructions (Signed)
Rennie NatterJennifer R Thumm, I greatly value your feedback.  If you receive a survey following your visit with Tracey Reese today, we appreciate you taking the time to fill it out.  Thanks, Joellyn HaffKim Carma Dwiggins, CNM, WHNP-BC   Second Trimester of Pregnancy The second trimester is from week 14 through week 27 (months 4 through 6). The second trimester is often a time when you feel your best. Your body has adjusted to being pregnant, and you begin to feel better physically. Usually, morning sickness has lessened or quit completely, you may have more energy, and you may have an increase in appetite. The second trimester is also a time when the fetus is growing rapidly. At the end of the sixth month, the fetus is about 9 inches long and weighs about 1 pounds. You will likely begin to feel the baby move (quickening) between 16 and 20 weeks of pregnancy. Body changes during your second trimester Your body continues to go through many changes during your second trimester. The changes vary from woman to woman.  Your weight will continue to increase. You will notice your lower abdomen bulging out.  You may begin to get stretch marks on your hips, abdomen, and breasts.  You may develop headaches that can be relieved by medicines. The medicines should be approved by your health care provider.  You may urinate more often because the fetus is pressing on your bladder.  You may develop or continue to have heartburn as a result of your pregnancy.  You may develop constipation because certain hormones are causing the muscles that push waste through your intestines to slow down.  You may develop hemorrhoids or swollen, bulging veins (varicose veins).  You may have back pain. This is caused by: ? Weight gain. ? Pregnancy hormones that are relaxing the joints in your pelvis. ? A shift in weight and the muscles that support your balance.  Your breasts will continue to grow and they will continue to become tender.  Your gums may bleed  and may be sensitive to brushing and flossing.  Dark spots or blotches (chloasma, mask of pregnancy) may develop on your face. This will likely fade after the baby is born.  A dark line from your belly button to the pubic area (linea nigra) may appear. This will likely fade after the baby is born.  You may have changes in your hair. These can include thickening of your hair, rapid growth, and changes in texture. Some women also have hair loss during or after pregnancy, or hair that feels dry or thin. Your hair will most likely return to normal after your baby is born.  What to expect at prenatal visits During a routine prenatal visit:  You will be weighed to make sure you and the fetus are growing normally.  Your blood pressure will be taken.  Your abdomen will be measured to track your baby's growth.  The fetal heartbeat will be listened to.  Any test results from the previous visit will be discussed.  Your health care provider may ask you:  How you are feeling.  If you are feeling the baby move.  If you have had any abnormal symptoms, such as leaking fluid, bleeding, severe headaches, or abdominal cramping.  If you are using any tobacco products, including cigarettes, chewing tobacco, and electronic cigarettes.  If you have any questions.  Other tests that may be performed during your second trimester include:  Blood tests that check for: ? Low iron levels (anemia). ? High  blood sugar that affects pregnant women (gestational diabetes) between 3 and 28 weeks. ? Rh antibodies. This is to check for a protein on red blood cells (Rh factor).  Urine tests to check for infections, diabetes, or protein in the urine.  An ultrasound to confirm the proper growth and development of the baby.  An amniocentesis to check for possible genetic problems.  Fetal screens for spina bifida and Down syndrome.  HIV (human immunodeficiency virus) testing. Routine prenatal testing includes  screening for HIV, unless you choose not to have this test.  Follow these instructions at home: Medicines  Follow your health care provider's instructions regarding medicine use. Specific medicines may be either safe or unsafe to take during pregnancy.  Take a prenatal vitamin that contains at least 600 micrograms (mcg) of folic acid.  If you develop constipation, try taking a stool softener if your health care provider approves. Eating and drinking  Eat a balanced diet that includes fresh fruits and vegetables, whole grains, good sources of protein such as meat, eggs, or tofu, and low-fat dairy. Your health care provider will help you determine the amount of weight gain that is right for you.  Avoid raw meat and uncooked cheese. These carry germs that can cause birth defects in the baby.  If you have low calcium intake from food, talk to your health care provider about whether you should take a daily calcium supplement.  Limit foods that are high in fat and processed sugars, such as fried and sweet foods.  To prevent constipation: ? Drink enough fluid to keep your urine clear or pale yellow. ? Eat foods that are high in fiber, such as fresh fruits and vegetables, whole grains, and beans. Activity  Exercise only as directed by your health care provider. Most women can continue their usual exercise routine during pregnancy. Try to exercise for 30 minutes at least 5 days a week. Stop exercising if you experience uterine contractions.  Avoid heavy lifting, wear low heel shoes, and practice good posture.  A sexual relationship may be continued unless your health care provider directs you otherwise. Relieving pain and discomfort  Wear a good support bra to prevent discomfort from breast tenderness.  Take warm sitz baths to soothe any pain or discomfort caused by hemorrhoids. Use hemorrhoid cream if your health care provider approves.  Rest with your legs elevated if you have leg cramps  or low back pain.  If you develop varicose veins, wear support hose. Elevate your feet for 15 minutes, 3-4 times a day. Limit salt in your diet. Prenatal Care  Write down your questions. Take them to your prenatal visits.  Keep all your prenatal visits as told by your health care provider. This is important. Safety  Wear your seat belt at all times when driving.  Make a list of emergency phone numbers, including numbers for family, friends, the hospital, and police and fire departments. General instructions  Ask your health care provider for a referral to a local prenatal education class. Begin classes no later than the beginning of month 6 of your pregnancy.  Ask for help if you have counseling or nutritional needs during pregnancy. Your health care provider can offer advice or refer you to specialists for help with various needs.  Do not use hot tubs, steam rooms, or saunas.  Do not douche or use tampons or scented sanitary pads.  Do not cross your legs for long periods of time.  Avoid cat litter boxes and soil  used by cats. These carry germs that can cause birth defects in the baby and possibly loss of the fetus by miscarriage or stillbirth.  Avoid all smoking, herbs, alcohol, and unprescribed drugs. Chemicals in these products can affect the formation and growth of the baby.  Do not use any products that contain nicotine or tobacco, such as cigarettes and e-cigarettes. If you need help quitting, ask your health care provider.  Visit your dentist if you have not gone yet during your pregnancy. Use a soft toothbrush to brush your teeth and be gentle when you floss. Contact a health care provider if:  You have dizziness.  You have mild pelvic cramps, pelvic pressure, or nagging pain in the abdominal area.  You have persistent nausea, vomiting, or diarrhea.  You have a bad smelling vaginal discharge.  You have pain when you urinate. Get help right away if:  You have a  fever.  You are leaking fluid from your vagina.  You have spotting or bleeding from your vagina.  You have severe abdominal cramping or pain.  You have rapid weight gain or weight loss.  You have shortness of breath with chest pain.  You notice sudden or extreme swelling of your face, hands, ankles, feet, or legs.  You have not felt your baby move in over an hour.  You have severe headaches that do not go away when you take medicine.  You have vision changes. Summary  The second trimester is from week 14 through week 27 (months 4 through 6). It is also a time when the fetus is growing rapidly.  Your body goes through many changes during pregnancy. The changes vary from woman to woman.  Avoid all smoking, herbs, alcohol, and unprescribed drugs. These chemicals affect the formation and growth your baby.  Do not use any tobacco products, such as cigarettes, chewing tobacco, and e-cigarettes. If you need help quitting, ask your health care provider.  Contact your health care provider if you have any questions. Keep all prenatal visits as told by your health care provider. This is important. This information is not intended to replace advice given to you by your health care provider. Make sure you discuss any questions you have with your health care provider. Document Released: 10/22/2001 Document Revised: 04/04/2016 Document Reviewed: 12/29/2012 Elsevier Interactive Patient Education  2017 [employer].

## 2017-12-09 LAB — INTEGRATED 1
CROWN RUMP LENGTH MAT SCREEN: 82 mm
GEST. AGE ON COLLECTION DATE: 13.4 wk
Maternal Age at EDD: 33 yr
NUMBER OF FETUSES: 1
Nuchal Translucency (NT): 1.5 mm
PAPP-A Value: 908.6 ng/mL
WEIGHT: 166 [lb_av]

## 2017-12-09 LAB — CYTOLOGY - PAP
DIAGNOSIS: NEGATIVE
HPV (WINDOPATH): NOT DETECTED

## 2017-12-10 ENCOUNTER — Telehealth: Payer: Self-pay | Admitting: Adult Health

## 2017-12-10 NOTE — Telephone Encounter (Signed)
Patient states she has taken some flexeril for her neck stiffness but wanted to make sure it was ok. Informed patient that since she is out of her first trimester it is fine but to only take as needed. Verbalized understanding.

## 2018-01-08 ENCOUNTER — Ambulatory Visit (INDEPENDENT_AMBULATORY_CARE_PROVIDER_SITE_OTHER): Payer: Commercial Managed Care - PPO

## 2018-01-08 ENCOUNTER — Encounter: Payer: Self-pay | Admitting: Women's Health

## 2018-01-08 ENCOUNTER — Other Ambulatory Visit: Payer: Self-pay

## 2018-01-08 ENCOUNTER — Ambulatory Visit (INDEPENDENT_AMBULATORY_CARE_PROVIDER_SITE_OTHER): Payer: Commercial Managed Care - PPO | Admitting: Women's Health

## 2018-01-08 VITALS — BP 100/62 | HR 91 | Wt 167.0 lb

## 2018-01-08 DIAGNOSIS — Z3A18 18 weeks gestation of pregnancy: Secondary | ICD-10-CM

## 2018-01-08 DIAGNOSIS — Z363 Encounter for antenatal screening for malformations: Secondary | ICD-10-CM | POA: Diagnosis not present

## 2018-01-08 DIAGNOSIS — O09292 Supervision of pregnancy with other poor reproductive or obstetric history, second trimester: Secondary | ICD-10-CM

## 2018-01-08 DIAGNOSIS — R5383 Other fatigue: Secondary | ICD-10-CM

## 2018-01-08 DIAGNOSIS — Z1389 Encounter for screening for other disorder: Secondary | ICD-10-CM

## 2018-01-08 DIAGNOSIS — Z1379 Encounter for other screening for genetic and chromosomal anomalies: Secondary | ICD-10-CM

## 2018-01-08 DIAGNOSIS — Z3482 Encounter for supervision of other normal pregnancy, second trimester: Secondary | ICD-10-CM

## 2018-01-08 DIAGNOSIS — K5900 Constipation, unspecified: Secondary | ICD-10-CM

## 2018-01-08 DIAGNOSIS — Z331 Pregnant state, incidental: Secondary | ICD-10-CM

## 2018-01-08 DIAGNOSIS — Z3481 Encounter for supervision of other normal pregnancy, first trimester: Secondary | ICD-10-CM

## 2018-01-08 LAB — POCT URINALYSIS DIPSTICK
Blood, UA: NEGATIVE
GLUCOSE UA: NEGATIVE
Ketones, UA: NEGATIVE
LEUKOCYTES UA: NEGATIVE
Nitrite, UA: NEGATIVE
Protein, UA: NEGATIVE

## 2018-01-08 LAB — POCT HEMOGLOBIN: Hemoglobin: 12.6 g/dL (ref 12.2–16.2)

## 2018-01-08 NOTE — Progress Notes (Signed)
   LOW-RISK PREGNANCY VISIT Patient name: Tracey NatterJennifer R Catalano MRN [id number]  Date of birth: 12/27/1985 Chief Complaint:   Routine Prenatal Visit (u/s today)  History of Present Illness:   Tracey NatterJennifer R Herringshaw is a 32 y.o. [id number] female at 6954w5d with an Estimated Date of Delivery: 06/06/18 being seen today for ongoing management of a low-risk pregnancy.  Today she reports constipation, taking colace 250mg  daily, miralax occ. Feels fatigued all the time.  Vag. Bleeding: None.  Movement: Present. denies leaking of fluid. Review of Systems:   Pertinent items are noted in HPI Denies abnormal vaginal discharge w/ itching/odor/irritation, headaches, visual changes, shortness of breath, chest pain, abdominal pain, severe nausea/vomiting, or problems with urination or bowel movements unless otherwise stated above. Pertinent History Reviewed:  Reviewed past medical,surgical, social, obstetrical and family history.  Reviewed problem list, medications and allergies. Physical Assessment:   Vitals:   01/08/18 1055  BP: 100/62  Pulse: 91  Weight: 167 lb (75.8 kg)  Body mass index is 30.54 kg/m.        Physical Examination:   General appearance: Well appearing, and in no distress  Mental status: Alert, oriented to person, place, and time  Skin: Warm & dry  Cardiovascular: Normal heart rate noted  Respiratory: Normal respiratory effort, no distress  Abdomen: Soft, gravid, nontender  Pelvic: Cervical exam deferred         Extremities: Edema: None  Fetal Status: Fetal Heart Rate (bpm): 159 u/s   Movement: Present    US 18+5 wks,breech,cx 3.9 cm,posterior pl gr 0,tip of placenta 2.7 cm from cx os wnl,normal ovaries bilat,EFW 243 g 32%.fhr 159 bpm,svp of fluid 3.8 cm,anatomy complete,no obvious abnormalities   Results for orders placed or performed in visit on 01/08/18 (from the past 24 hour(s))  POCT urinalysis dipstick   Collection Time: 01/08/18 10:39 AM  Result Value Ref Range   Color, UA     Clarity, UA     Glucose, UA neg    Bilirubin, UA     Ketones, UA neg    Spec Grav, UA  1.010 - 1.025   Blood, UA neg    pH, UA  5.0 - 8.0   Protein, UA neg    Urobilinogen, UA  0.2 or 1.0 E.U./dL   Nitrite, UA neg    Leukocytes, UA Negative Negative   Appearance     Odor    POCT hemoglobin   Collection Time: 01/08/18 11:17 AM  Result Value Ref Range   Hemoglobin 12.6 12.2 - 16.2 g/dL    Assessment & Plan:  1) Low-risk pregnancy [id number] at 8454w5d with an Estimated Date of Delivery: 06/06/18   2) Constipation, gave printed prevention/relief measures, increase miralax to daily  3) Fatigue> normal hgb   Meds: No orders of the defined types were placed in this encounter.  Labs/procedures today: anatomy u/s, 2nd IT  Plan:  Continue routine obstetrical care   Reviewed: Preterm labor symptoms and general obstetric precautions including but not limited to vaginal bleeding, contractions, leaking of fluid and fetal movement were reviewed in detail with the patient.  All questions were answered  Follow-up: Return in about 4 weeks (around 02/05/2018) for LROB.  Orders Placed This Encounter  Procedures  . INTEGRATED 2  . POCT urinalysis dipstick  . POCT hemoglobin   Cheral MarkerKimberly R Briceyda Abdullah CNM, [facility]NP-BC 01/08/2018 11:24 AM

## 2018-01-08 NOTE — Patient Instructions (Addendum)
Rennie NatterJennifer R Joiner, I greatly value your feedback.  If you receive a survey following your visit with us today, we appreciate you taking the time to fill it out.  Thanks, Joellyn HaffKim Jannell Franta, CNM, WHNP-BC  Constipation  Drink plenty of fluid, preferably water, throughout the day  Eat foods high in fiber such as fruits, vegetables, and grains  Exercise, such as walking, is a good way to keep your bowels regular  Drink warm fluids, especially warm prune juice, or decaf coffee  Eat a 1/2 cup of real oatmeal (not instant), 1/2 cup applesauce, and 1/2-1 cup warm prune juice every day  If needed, you may take Colace (docusate sodium) stool softener once or twice a day to help keep the stool soft. If you are pregnant, wait until you are out of your first trimester (12-14 weeks of pregnancy)  If you still are having problems with constipation, you may take Miralax once daily as needed to help keep your bowels regular.  If you are pregnant, wait until you are out of your first trimester (12-14 weeks of pregnancy)      Second Trimester of Pregnancy The second trimester is from week 14 through week 27 (months 4 through 6). The second trimester is often a time when you feel your best. Your body has adjusted to being pregnant, and you begin to feel better physically. Usually, morning sickness has lessened or quit completely, you may have more energy, and you may have an increase in appetite. The second trimester is also a time when the fetus is growing rapidly. At the end of the sixth month, the fetus is about 9 inches long and weighs about 1 pounds. You will likely begin to feel the baby move (quickening) between 16 and 20 weeks of pregnancy. Body changes during your second trimester Your body continues to go through many changes during your second trimester. The changes vary from woman to woman.  Your weight will continue to increase. You will notice your lower abdomen bulging out.  You may begin to get  stretch marks on your hips, abdomen, and breasts.  You may develop headaches that can be relieved by medicines. The medicines should be approved by your health care provider.  You may urinate more often because the fetus is pressing on your bladder.  You may develop or continue to have heartburn as a result of your pregnancy.  You may develop constipation because certain hormones are causing the muscles that push waste through your intestines to slow down.  You may develop hemorrhoids or swollen, bulging veins (varicose veins).  You may have back pain. This is caused by: ? Weight gain. ? Pregnancy hormones that are relaxing the joints in your pelvis. ? A shift in weight and the muscles that support your balance.  Your breasts will continue to grow and they will continue to become tender.  Your gums may bleed and may be sensitive to brushing and flossing.  Dark spots or blotches (chloasma, mask of pregnancy) may develop on your face. This will likely fade after the baby is born.  A dark line from your belly button to the pubic area (linea nigra) may appear. This will likely fade after the baby is born.  You may have changes in your hair. These can include thickening of your hair, rapid growth, and changes in texture. Some women also have hair loss during or after pregnancy, or hair that feels dry or thin. Your hair will most likely return to normal after  your baby is born.  What to expect at prenatal visits During a routine prenatal visit:  You will be weighed to make sure you and the fetus are growing normally.  Your blood pressure will be taken.  Your abdomen will be measured to track your baby's growth.  The fetal heartbeat will be listened to.  Any test results from the previous visit will be discussed.  Your health care provider may ask you:  How you are feeling.  If you are feeling the baby move.  If you have had any abnormal symptoms, such as leaking fluid,  bleeding, severe headaches, or abdominal cramping.  If you are using any tobacco products, including cigarettes, chewing tobacco, and electronic cigarettes.  If you have any questions.  Other tests that may be performed during your second trimester include:  Blood tests that check for: ? Low iron levels (anemia). ? High blood sugar that affects pregnant women (gestational diabetes) between 37 and 28 weeks. ? Rh antibodies. This is to check for a protein on red blood cells (Rh factor).  Urine tests to check for infections, diabetes, or protein in the urine.  An ultrasound to confirm the proper growth and development of the baby.  An amniocentesis to check for possible genetic problems.  Fetal screens for spina bifida and Down syndrome.  HIV (human immunodeficiency virus) testing. Routine prenatal testing includes screening for HIV, unless you choose not to have this test.  Follow these instructions at home: Medicines  Follow your health care provider's instructions regarding medicine use. Specific medicines may be either safe or unsafe to take during pregnancy.  Take a prenatal vitamin that contains at least 600 micrograms (mcg) of folic acid.  If you develop constipation, try taking a stool softener if your health care provider approves. Eating and drinking  Eat a balanced diet that includes fresh fruits and vegetables, whole grains, good sources of protein such as meat, eggs, or tofu, and low-fat dairy. Your health care provider will help you determine the amount of weight gain that is right for you.  Avoid raw meat and uncooked cheese. These carry germs that can cause birth defects in the baby.  If you have low calcium intake from food, talk to your health care provider about whether you should take a daily calcium supplement.  Limit foods that are high in fat and processed sugars, such as fried and sweet foods.  To prevent constipation: ? Drink enough fluid to keep your  urine clear or pale yellow. ? Eat foods that are high in fiber, such as fresh fruits and vegetables, whole grains, and beans. Activity  Exercise only as directed by your health care provider. Most women can continue their usual exercise routine during pregnancy. Try to exercise for 30 minutes at least 5 days a week. Stop exercising if you experience uterine contractions.  Avoid heavy lifting, wear low heel shoes, and practice good posture.  A sexual relationship may be continued unless your health care provider directs you otherwise. Relieving pain and discomfort  Wear a good support bra to prevent discomfort from breast tenderness.  Take warm sitz baths to soothe any pain or discomfort caused by hemorrhoids. Use hemorrhoid cream if your health care provider approves.  Rest with your legs elevated if you have leg cramps or low back pain.  If you develop varicose veins, wear support hose. Elevate your feet for 15 minutes, 3-4 times a day. Limit salt in your diet. Prenatal Care  Write down your  questions. Take them to your prenatal visits.  Keep all your prenatal visits as told by your health care provider. This is important. Safety  Wear your seat belt at all times when driving.  Make a list of emergency phone numbers, including numbers for family, friends, the hospital, and police and fire departments. General instructions  Ask your health care provider for a referral to a local prenatal education class. Begin classes no later than the beginning of month 6 of your pregnancy.  Ask for help if you have counseling or nutritional needs during pregnancy. Your health care provider can offer advice or refer you to specialists for help with various needs.  Do not use hot tubs, steam rooms, or saunas.  Do not douche or use tampons or scented sanitary pads.  Do not cross your legs for long periods of time.  Avoid cat litter boxes and soil used by cats. These carry germs that can cause  birth defects in the baby and possibly loss of the fetus by miscarriage or stillbirth.  Avoid all smoking, herbs, alcohol, and unprescribed drugs. Chemicals in these products can affect the formation and growth of the baby.  Do not use any products that contain nicotine or tobacco, such as cigarettes and e-cigarettes. If you need help quitting, ask your health care provider.  Visit your dentist if you have not gone yet during your pregnancy. Use a soft toothbrush to brush your teeth and be gentle when you floss. Contact a health care provider if:  You have dizziness.  You have mild pelvic cramps, pelvic pressure, or nagging pain in the abdominal area.  You have persistent nausea, vomiting, or diarrhea.  You have a bad smelling vaginal discharge.  You have pain when you urinate. Get help right away if:  You have a fever.  You are leaking fluid from your vagina.  You have spotting or bleeding from your vagina.  You have severe abdominal cramping or pain.  You have rapid weight gain or weight loss.  You have shortness of breath with chest pain.  You notice sudden or extreme swelling of your face, hands, ankles, feet, or legs.  You have not felt your baby move in over an hour.  You have severe headaches that do not go away when you take medicine.  You have vision changes. Summary  The second trimester is from week 14 through week 27 (months 4 through 6). It is also a time when the fetus is growing rapidly.  Your body goes through many changes during pregnancy. The changes vary from woman to woman.  Avoid all smoking, herbs, alcohol, and unprescribed drugs. These chemicals affect the formation and growth your baby.  Do not use any tobacco products, such as cigarettes, chewing tobacco, and e-cigarettes. If you need help quitting, ask your health care provider.  Contact your health care provider if you have any questions. Keep all prenatal visits as told by your health care  provider. This is important. This information is not intended to replace advice given to you by your health care provider. Make sure you discuss any questions you have with your health care provider. Document Released: 10/22/2001 Document Revised: 04/04/2016 Document Reviewed: 12/29/2012 Elsevier Interactive Patient Education  2017 [employer].

## 2018-01-08 NOTE — Progress Notes (Signed)
US 18+5 wks,breech,cx 3.9 cm,posterior pl gr 0,tip of placenta 2.7 cm from cx os,normal ovaries bilat,EFW 243 g 32%.fhr 159 bpm,svp of fluid 3.8 cm,anatomy complete,no obvious abnormalities

## 2018-01-16 LAB — INTEGRATED 2
ADSF: 1
AFP MoM: 1.08
Alpha-Fetoprotein: 45.1 ng/mL
Crown Rump Length: 82 mm
DIA MOM: 0.46
DIA VALUE: 76.8 pg/mL
ESTRIOL UNCONJUGATED: 1.44 ng/mL
Gest. Age on Collection Date: 13.4 weeks
Gestational Age: 18.9 weeks
HCG MOM: 0.77
Maternal Age at EDD: 33 yr
NUCHAL TRANSLUCENCY (NT): 1.5 mm
NUCHAL TRANSLUCENCY MOM: 0.75
Number of Fetuses: 1
PAPP-A MoM: 0.75
PAPP-A Value: 908.6 ng/mL
TEST RESULTS: NEGATIVE
Weight: 166 [lb_av]
Weight: 166 [lb_av]
hCG Value: 16.2 IU/mL

## 2018-02-05 ENCOUNTER — Encounter: Payer: Commercial Managed Care - PPO | Admitting: Advanced Practice Midwife

## 2018-02-11 ENCOUNTER — Encounter: Payer: Self-pay | Admitting: Advanced Practice Midwife

## 2018-02-11 ENCOUNTER — Ambulatory Visit (INDEPENDENT_AMBULATORY_CARE_PROVIDER_SITE_OTHER): Payer: Commercial Managed Care - PPO | Admitting: Advanced Practice Midwife

## 2018-02-11 VITALS — BP 118/80 | HR 99 | Wt 176.0 lb

## 2018-02-11 DIAGNOSIS — Z3A23 23 weeks gestation of pregnancy: Secondary | ICD-10-CM

## 2018-02-11 DIAGNOSIS — Z1389 Encounter for screening for other disorder: Secondary | ICD-10-CM

## 2018-02-11 DIAGNOSIS — Z3482 Encounter for supervision of other normal pregnancy, second trimester: Secondary | ICD-10-CM

## 2018-02-11 DIAGNOSIS — Z331 Pregnant state, incidental: Secondary | ICD-10-CM

## 2018-02-11 LAB — POCT URINALYSIS DIPSTICK
Blood, UA: NEGATIVE
Glucose, UA: NEGATIVE
KETONES UA: NEGATIVE
LEUKOCYTES UA: NEGATIVE
NITRITE UA: NEGATIVE
Protein, UA: NEGATIVE

## 2018-02-11 NOTE — Patient Instructions (Signed)

## 2018-02-11 NOTE — Progress Notes (Signed)
.    [id number] 6851w4d Estimated Date of Delivery: 06/06/18  Blood pressure 118/80, pulse 99, weight 176 lb (79.8 kg), last menstrual period 08/30/2017.   BP weight and urine results all reviewed and noted.  Please refer to the obstetrical flow sheet for the fundal height and fetal heart rate documentation:  Patient reports good fetal movement, denies any bleeding and no rupture of membranes symptoms or regular contractions. Patient is without complaints. All questions were answered.   Physical Assessment:   Vitals:   02/11/18 1017  BP: 118/80  Pulse: 99  Weight: 176 lb (79.8 kg)  Body mass index is 32.19 kg/m.        Physical Examination:   General appearance: Well appearing, and in no distress  Mental status: Alert, oriented to person, place, and time  Skin: Warm & dry  Cardiovascular: Normal heart rate noted  Respiratory: Normal respiratory effort, no distress  Abdomen: Soft, gravid, nontender  Pelvic: Cervical exam deferred         Extremities: Edema: None  Fetal Status:     Movement: Present    Results for orders placed or performed in visit on 02/11/18 (from the past 24 hour(s))  POCT urinalysis dipstick   Collection Time: 02/11/18 10:24 AM  Result Value Ref Range   Color, UA     Clarity, UA     Glucose, UA neg    Bilirubin, UA     Ketones, UA neg    Spec Grav, UA  1.010 - 1.025   Blood, UA neg    pH, UA  5.0 - 8.0   Protein, UA neg    Urobilinogen, UA  0.2 or 1.0 E.U./dL   Nitrite, UA neg    Leukocytes, UA Negative Negative   Appearance     Odor       Orders Placed This Encounter  Procedures  . POCT urinalysis dipstick    Plan:  Continued routine obstetrical care,   Return in about 1 month (around 03/11/2018) for PN2/LROB.

## 2018-03-11 ENCOUNTER — Other Ambulatory Visit: Payer: Self-pay

## 2018-03-11 ENCOUNTER — Other Ambulatory Visit: Payer: Commercial Managed Care - PPO

## 2018-03-11 ENCOUNTER — Encounter: Payer: Self-pay | Admitting: Women's Health

## 2018-03-11 ENCOUNTER — Ambulatory Visit (INDEPENDENT_AMBULATORY_CARE_PROVIDER_SITE_OTHER): Payer: Commercial Managed Care - PPO | Admitting: Women's Health

## 2018-03-11 VITALS — BP 120/64 | HR 90 | Wt 183.0 lb

## 2018-03-11 DIAGNOSIS — Z331 Pregnant state, incidental: Secondary | ICD-10-CM

## 2018-03-11 DIAGNOSIS — Z1389 Encounter for screening for other disorder: Secondary | ICD-10-CM

## 2018-03-11 DIAGNOSIS — F418 Other specified anxiety disorders: Secondary | ICD-10-CM

## 2018-03-11 DIAGNOSIS — O99342 Other mental disorders complicating pregnancy, second trimester: Secondary | ICD-10-CM

## 2018-03-11 DIAGNOSIS — O34219 Maternal care for unspecified type scar from previous cesarean delivery: Secondary | ICD-10-CM

## 2018-03-11 DIAGNOSIS — O1202 Gestational edema, second trimester: Secondary | ICD-10-CM

## 2018-03-11 DIAGNOSIS — O09299 Supervision of pregnancy with other poor reproductive or obstetric history, unspecified trimester: Secondary | ICD-10-CM

## 2018-03-11 DIAGNOSIS — O09292 Supervision of pregnancy with other poor reproductive or obstetric history, second trimester: Secondary | ICD-10-CM

## 2018-03-11 DIAGNOSIS — Z131 Encounter for screening for diabetes mellitus: Secondary | ICD-10-CM

## 2018-03-11 DIAGNOSIS — Z3482 Encounter for supervision of other normal pregnancy, second trimester: Secondary | ICD-10-CM

## 2018-03-11 DIAGNOSIS — Z3A27 27 weeks gestation of pregnancy: Secondary | ICD-10-CM

## 2018-03-11 DIAGNOSIS — J069 Acute upper respiratory infection, unspecified: Secondary | ICD-10-CM

## 2018-03-11 LAB — POCT URINALYSIS DIPSTICK
Blood, UA: NEGATIVE
GLUCOSE UA: NEGATIVE
Ketones, UA: NEGATIVE
LEUKOCYTES UA: NEGATIVE
NITRITE UA: NEGATIVE
PROTEIN UA: NEGATIVE

## 2018-03-11 NOTE — Progress Notes (Signed)
LOW-RISK PREGNANCY VISIT Patient name: Tracey Reese MRN [id number]  Date of birth: 1986/04/26 Chief Complaint:   Routine Prenatal Visit (PN2 today)  History of Present Illness:   Tracey Reese is a 32 y.o. [id number] female at [redacted]w[redacted]d with an Estimated Date of Delivery: 06/06/18 being seen today for ongoing management of a low-risk pregnancy.  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 BP's have been normal at home. Wearing compression stockings for varicose and spider veins in legs. Has cold since Sunday, feels like maybe getting a little better, taking Robitusin.  H/O PPD, dep/anx- taking wellbutrin, feels disconnected from this pregnancy d/t h/o multiple SABs. Declines med adjustment/counseling/therapy.  Contractions: Not present. Vag. Bleeding: None.  Movement: Present. denies leaking of fluid. Review of Systems:   Pertinent items are noted in HPI Denies abnormal vaginal discharge w/ itching/odor/irritation, headaches, visual changes, shortness of breath, chest pain, abdominal pain, severe nausea/vomiting, or problems with urination or bowel movements unless otherwise stated above. Pertinent History Reviewed:  Reviewed past medical,surgical, social, obstetrical and family history.  Reviewed problem list, medications and allergies. Physical Assessment:   Vitals:   03/11/18 0923  BP: 120/64  Pulse: 90  Weight: 183 lb (83 kg)  Body mass index is 33.47 kg/m.        Physical Examination:   General appearance: Well appearing, and in no distress  Mental status: Alert, oriented to person, place, and time  Skin: Warm & dry  Cardiovascular: Normal heart rate noted  Respiratory: Normal respiratory effort, no distress  Abdomen: Soft, gravid, nontender  Pelvic: Cervical exam deferred         Extremities: Edema: Trace  Fetal Status: Fetal Heart Rate (bpm): 152 Fundal Height: 28 cm Movement: Present    Results for orders  placed or performed in visit on 03/11/18 (from the past 24 hour(s))  POCT urinalysis dipstick   Collection Time: 03/11/18  9:24 AM  Result Value Ref Range   Color, UA     Clarity, UA     Glucose, UA neg    Bilirubin, UA     Ketones, UA neg    Spec Grav, UA  1.010 - 1.025   Blood, UA neg    pH, UA  5.0 - 8.0   Protein, UA neg    Urobilinogen, UA  0.2 or 1.0 E.U./dL   Nitrite, UA neg    Leukocytes, UA Negative Negative   Appearance     Odor      Assessment & Plan:  1) Low-risk pregnancy [id number] at [redacted]w[redacted]d with an Estimated Date of Delivery: 06/06/18   2) Cold, gave printed relief measures, let us know if not improving after 7-10d from onset of sx, or if worsening  3) Peripheral edema> bp's normal, can keep checking at home d/t h/o pre-e   4) Dep/anx> continue wellbutrin, let us know if feels needs med adjustment/counseling  5) Prev c/s x 2> for RCS w/ BTL  6) H/O pre-e> continue baby ASA, reviewed s/s    Meds: No orders of the defined types were placed in this encounter.  Labs/procedures today: PN2  Plan:  Continue routine obstetrical care   Reviewed: Preterm labor symptoms and general obstetric precautions including but not limited to vaginal bleeding, contractions, leaking of fluid and fetal movement were reviewed in detail with the patient.  Recommended Tdap at HD/PCP per CDC guidelines. All questions were answered  Follow-up: Return in  about 1 month (around 04/08/2018) for LROB.  Orders Placed This Encounter  Procedures  . POCT urinalysis dipstick   Cheral Marker CNM, [facility] 03/11/2018 9:59 AM

## 2018-03-11 NOTE — Patient Instructions (Addendum)
Tracey Reese, I greatly value your feedback.  If you receive a survey following your visit with [place] today, we appreciate you taking the time to fill it out.  Thanks, Joellyn Haff, CNM, WHNP-BC  You have a viral infection that will resolve on its own over time.  Symptoms typically last 3-7 days but can stretch out to 2-3 weeks.  Unfortunately, antibiotics are not helpful for viral infections.  Humidifier and saline nasal spray for nasal congestion  Regular robitussin, cough drops for cough  Warm salt water gargles for sore throat  Mucinex with lots of water to help you cough up the mucous in your chest if needed  Drink plenty of fluids and stay hydrated!  Wash your hands frequently.  Call if you are not improving by 7-10 days.      Call the office [phone]) or go to [facility] if:  You begin to have strong, frequent contractions  Your water breaks.  Sometimes it is a big gush of fluid, sometimes it is just a trickle that keeps getting your panties wet or running down your legs  You have vaginal bleeding.  It is normal to have a small amount of spotting if your cervix was checked.   You don't feel your baby moving like normal.  If you don't, get you something to eat and drink and lay down and focus on feeling your baby move.  You should feel at least 10 movements in 2 hours.  If you don't, you should call the office or go to [facility].    Tdap Vaccine  It is recommended that you get the Tdap vaccine during the third trimester of EACH pregnancy to help protect your baby from getting pertussis (whooping cough)  27-36 weeks is the BEST time to do this so that you can pass the protection on to your baby. During pregnancy is better than after pregnancy, but if you are unable to get it during pregnancy it will be offered at the hospital.   You can get this vaccine at the health department or your family doctor  Everyone who will be around your baby should also be  up-to-date on their vaccines. Adults (who are not pregnant) only need 1 dose of Tdap during adulthood.   Third Trimester of Pregnancy The third trimester is from week 29 through week 42, months 7 through 9. The third trimester is a time when the fetus is growing rapidly. At the end of the ninth month, the fetus is about 20 inches in length and weighs 6-10 pounds.  BODY CHANGES Your body goes through many changes during pregnancy. The changes vary from woman to woman.   Your weight will continue to increase. You can expect to gain 25-35 pounds (11-16 kg) by the end of the pregnancy.  You may begin to get stretch marks on your hips, abdomen, and breasts.  You may urinate more often because the fetus is moving lower into your pelvis and pressing on your bladder.  You may develop or continue to have heartburn as a result of your pregnancy.  You may develop constipation because certain hormones are causing the muscles that push waste through your intestines to slow down.  You may develop hemorrhoids or swollen, bulging veins (varicose veins).  You may have pelvic pain because of the weight gain and pregnancy hormones relaxing your joints between the bones in your pelvis. Backaches may result from overexertion of the muscles supporting your posture.  You may have changes  in your hair. These can include thickening of your hair, rapid growth, and changes in texture. Some women also have hair loss during or after pregnancy, or hair that feels dry or thin. Your hair will most likely return to normal after your baby is born.  Your breasts will continue to grow and be tender. A yellow discharge may leak from your breasts called colostrum.  Your belly button may stick out.  You may feel short of breath because of your expanding uterus.  You may notice the fetus "dropping," or moving lower in your abdomen.  You may have a bloody mucus discharge. This usually occurs a few days to a week before labor  begins.  Your cervix becomes thin and soft (effaced) near your due date. WHAT TO EXPECT AT YOUR PRENATAL EXAMS  You will have prenatal exams every 2 weeks until week 36. Then, you will have weekly prenatal exams. During a routine prenatal visit:  You will be weighed to make sure you and the fetus are growing normally.  Your blood pressure is taken.  Your abdomen will be measured to track your baby's growth.  The fetal heartbeat will be listened to.  Any test results from the previous visit will be discussed.  You may have a cervical check near your due date to see if you have effaced. At around 36 weeks, your caregiver will check your cervix. At the same time, your caregiver will also perform a test on the secretions of the vaginal tissue. This test is to determine if a type of bacteria, Group B streptococcus, is present. Your caregiver will explain this further. Your caregiver may ask you:  What your birth plan is.  How you are feeling.  If you are feeling the baby move.  If you have had any abnormal symptoms, such as leaking fluid, bleeding, severe headaches, or abdominal cramping.  If you have any questions. Other tests or screenings that may be performed during your third trimester include:  Blood tests that check for low iron levels (anemia).  Fetal testing to check the health, activity level, and growth of the fetus. Testing is done if you have certain medical conditions or if there are problems during the pregnancy. FALSE LABOR You may feel small, irregular contractions that eventually go away. These are called Braxton Hicks contractions, or false labor. Contractions may last for hours, days, or even weeks before true labor sets in. If contractions come at regular intervals, intensify, or become painful, it is best to be seen by your caregiver.  SIGNS OF LABOR   Menstrual-like cramps.  Contractions that are 5 minutes apart or less.  Contractions that start on the top  of the uterus and spread down to the lower abdomen and back.  A sense of increased pelvic pressure or back pain.  A watery or bloody mucus discharge that comes from the vagina. If you have any of these signs before the 37th week of pregnancy, call your caregiver right away. You need to go to the hospital to get checked immediately. HOME CARE INSTRUCTIONS   Avoid all smoking, herbs, alcohol, and unprescribed drugs. These chemicals affect the formation and growth of the baby.  Follow your caregiver's instructions regarding medicine use. There are medicines that are either safe or unsafe to take during pregnancy.  Exercise only as directed by your caregiver. Experiencing uterine cramps is a good sign to stop exercising.  Continue to eat regular, healthy meals.  Wear a good support bra for breast  tenderness.  Do not use hot tubs, steam rooms, or saunas.  Wear your seat belt at all times when driving.  Avoid raw meat, uncooked cheese, cat litter boxes, and soil used by cats. These carry germs that can cause birth defects in the baby.  Take your prenatal vitamins.  Try taking a stool softener (if your caregiver approves) if you develop constipation. Eat more high-fiber foods, such as fresh vegetables or fruit and whole grains. Drink plenty of fluids to keep your urine clear or pale yellow.  Take warm sitz baths to soothe any pain or discomfort caused by hemorrhoids. Use hemorrhoid cream if your caregiver approves.  If you develop varicose veins, wear support hose. Elevate your feet for 15 minutes, 3-4 times a day. Limit salt in your diet.  Avoid heavy lifting, wear low heal shoes, and practice good posture.  Rest a lot with your legs elevated if you have leg cramps or low back pain.  Visit your dentist if you have not gone during your pregnancy. Use a soft toothbrush to brush your teeth and be gentle when you floss.  A sexual relationship may be continued unless your caregiver directs  you otherwise.  Do not travel far distances unless it is absolutely necessary and only with the approval of your caregiver.  Take prenatal classes to understand, practice, and ask questions about the labor and delivery.  Make a trial run to the hospital.  Pack your hospital bag.  Prepare the baby's nursery.  Continue to go to all your prenatal visits as directed by your caregiver. SEEK MEDICAL CARE IF:  You are unsure if you are in labor or if your water has broken.  You have dizziness.  You have mild pelvic cramps, pelvic pressure, or nagging pain in your abdominal area.  You have persistent nausea, vomiting, or diarrhea.  You have a bad smelling vaginal discharge.  You have pain with urination. SEEK IMMEDIATE MEDICAL CARE IF:   You have a fever.  You are leaking fluid from your vagina.  You have spotting or bleeding from your vagina.  You have severe abdominal cramping or pain.  You have rapid weight loss or gain.  You have shortness of breath with chest pain.  You notice sudden or extreme swelling of your face, hands, ankles, feet, or legs.  You have not felt your baby move in over an hour.  You have severe headaches that do not go away with medicine.  You have vision changes. Document Released: 10/22/2001 Document Revised: 11/02/2013 Document Reviewed: 12/29/2012 [facility] Patient Information 2015 [place], [place]. This information is not intended to replace advice given to you by your health care provider. Make sure you discuss any questions you have with your health care provider.

## 2018-03-12 LAB — GLUCOSE TOLERANCE, 2 HOURS W/ 1HR
GLUCOSE, 2 HOUR: 104 mg/dL (ref 65–152)
Glucose, 1 hour: 124 mg/dL (ref 65–179)
Glucose, Fasting: 71 mg/dL (ref 65–91)

## 2018-03-12 LAB — HIV ANTIBODY (ROUTINE TESTING W REFLEX): HIV SCREEN 4TH GENERATION: NONREACTIVE

## 2018-03-12 LAB — CBC
HEMATOCRIT: 37.9 % (ref 34.0–46.6)
Hemoglobin: 12.6 g/dL (ref 11.1–15.9)
MCH: 29.9 pg (ref 26.6–33.0)
MCHC: 33.2 g/dL (ref 31.5–35.7)
MCV: 90 fL (ref 79–97)
Platelets: 214 10*3/uL (ref 150–379)
RBC: 4.22 x10E6/uL (ref 3.77–5.28)
RDW: 13.9 % (ref 12.3–15.4)
WBC: 11.9 10*3/uL — AB (ref 3.4–10.8)

## 2018-03-12 LAB — RPR: RPR Ser Ql: NONREACTIVE

## 2018-03-12 LAB — ANTIBODY SCREEN: Antibody Screen: NEGATIVE

## 2018-04-10 ENCOUNTER — Ambulatory Visit (INDEPENDENT_AMBULATORY_CARE_PROVIDER_SITE_OTHER): Payer: Commercial Managed Care - PPO | Admitting: Obstetrics & Gynecology

## 2018-04-10 ENCOUNTER — Encounter: Payer: Self-pay | Admitting: Obstetrics & Gynecology

## 2018-04-10 ENCOUNTER — Other Ambulatory Visit: Payer: Self-pay

## 2018-04-10 VITALS — BP 108/74 | HR 94 | Wt 193.0 lb

## 2018-04-10 DIAGNOSIS — Z331 Pregnant state, incidental: Secondary | ICD-10-CM

## 2018-04-10 DIAGNOSIS — Z3A33 33 weeks gestation of pregnancy: Secondary | ICD-10-CM

## 2018-04-10 DIAGNOSIS — Z1389 Encounter for screening for other disorder: Secondary | ICD-10-CM

## 2018-04-10 DIAGNOSIS — Z3483 Encounter for supervision of other normal pregnancy, third trimester: Secondary | ICD-10-CM

## 2018-04-10 LAB — POCT URINALYSIS DIPSTICK
Glucose, UA: NEGATIVE
KETONES UA: NEGATIVE
LEUKOCYTES UA: NEGATIVE
Nitrite, UA: NEGATIVE
Protein, UA: NEGATIVE
RBC UA: NEGATIVE

## 2018-04-22 NOTE — Progress Notes (Signed)
[id number] 5860w4d Estimated Date of Delivery: 06/06/18  Blood pressure 108/74, pulse 94, weight 193 lb (87.5 kg), last menstrual period 08/30/2017.   BP weight and urine results all reviewed and noted.  Please refer to the obstetrical flow sheet for the fundal height and fetal heart rate documentation:  Patient reports good fetal movement, denies any bleeding and no rupture of membranes symptoms or regular contractions. Patient is without complaints. All questions were answered.  Orders Placed This Encounter  Procedures  . POCT urinalysis dipstick    Plan:  Continued routine obstetrical care,   Return in about 2 weeks (around 04/24/2018).

## 2018-04-24 ENCOUNTER — Encounter: Payer: Self-pay | Admitting: Obstetrics & Gynecology

## 2018-04-24 ENCOUNTER — Ambulatory Visit (INDEPENDENT_AMBULATORY_CARE_PROVIDER_SITE_OTHER): Payer: Commercial Managed Care - PPO | Admitting: Obstetrics & Gynecology

## 2018-04-24 VITALS — BP 118/80 | HR 86 | Wt 195.0 lb

## 2018-04-24 DIAGNOSIS — Z3A33 33 weeks gestation of pregnancy: Secondary | ICD-10-CM

## 2018-04-24 DIAGNOSIS — Z331 Pregnant state, incidental: Secondary | ICD-10-CM

## 2018-04-24 DIAGNOSIS — Z1389 Encounter for screening for other disorder: Secondary | ICD-10-CM

## 2018-04-24 DIAGNOSIS — Z3483 Encounter for supervision of other normal pregnancy, third trimester: Secondary | ICD-10-CM

## 2018-04-24 DIAGNOSIS — O34219 Maternal care for unspecified type scar from previous cesarean delivery: Secondary | ICD-10-CM

## 2018-04-24 LAB — POCT URINALYSIS DIPSTICK
Blood, UA: NEGATIVE
Glucose, UA: NEGATIVE
Ketones, UA: NEGATIVE
Leukocytes, UA: NEGATIVE
NITRITE UA: NEGATIVE
PROTEIN UA: NEGATIVE

## 2018-04-24 NOTE — Progress Notes (Signed)
[id number] 1873w6d Estimated Date of Delivery: 06/06/18  Blood pressure 118/80, pulse 86, weight 195 lb (88.5 kg), last menstrual period 08/30/2017.   BP weight and urine results all reviewed and noted.  Please refer to the obstetrical flow sheet for the fundal height and fetal heart rate documentation:  Patient reports good fetal movement, denies any bleeding and no rupture of membranes symptoms or regular contractions. Patient is without complaints. All questions were answered.  Orders Placed This Encounter  Procedures  . POCT urinalysis dipstick    Plan:  Continued routine obstetrical care, repeat C section + BTL 05/31/2018 With Dr Emelda FearFerguson  Return in about 2 weeks (around 05/08/2018) for LROB with Dr Emelda FearFerguson.

## 2018-05-08 ENCOUNTER — Encounter: Payer: Self-pay | Admitting: Obstetrics and Gynecology

## 2018-05-08 ENCOUNTER — Ambulatory Visit (INDEPENDENT_AMBULATORY_CARE_PROVIDER_SITE_OTHER): Payer: Commercial Managed Care - PPO | Admitting: Obstetrics and Gynecology

## 2018-05-08 VITALS — BP 118/70 | HR 91 | Wt 200.2 lb

## 2018-05-08 DIAGNOSIS — O34219 Maternal care for unspecified type scar from previous cesarean delivery: Secondary | ICD-10-CM

## 2018-05-08 DIAGNOSIS — Z3483 Encounter for supervision of other normal pregnancy, third trimester: Secondary | ICD-10-CM

## 2018-05-08 DIAGNOSIS — Z331 Pregnant state, incidental: Secondary | ICD-10-CM

## 2018-05-08 DIAGNOSIS — Z3A35 35 weeks gestation of pregnancy: Secondary | ICD-10-CM

## 2018-05-08 DIAGNOSIS — Z1389 Encounter for screening for other disorder: Secondary | ICD-10-CM

## 2018-05-08 LAB — POCT URINALYSIS DIPSTICK
Glucose, UA: NEGATIVE
LEUKOCYTES UA: NEGATIVE
Nitrite, UA: NEGATIVE
Protein, UA: NEGATIVE
RBC UA: NEGATIVE

## 2018-05-08 NOTE — Progress Notes (Signed)
Patient ID: Tracey NatterJennifer R Reese, female   DOB: 07/14/1986, 32 y.o.   MRN: [id number]    LOW-RISK PREGNANCY VISIT Patient name: Tracey Reese MRN [id number]  Date of birth: 08/16/1986 Chief Complaint:   Routine Prenatal Visit  History of Present Illness:   Tracey Reese is a 32 y.o. [id number] female at [facility] with an Estimated Date of Delivery: 06/06/18 being seen today for ongoing management of a low-risk pregnancy.  Today she reports no complaints. Pt is scheduled for a repeat C-section and BTL on 05/31/2018. This will be her third C-section.   Contractions: Irregular. Vag. Bleeding: None.  Movement: Present. denies leaking of fluid. Review of Systems:   Pertinent items are noted in HPI Denies abnormal vaginal discharge w/ itching/odor/irritation, headaches, visual changes, shortness of breath, chest pain, abdominal pain, severe nausea/vomiting, or problems with urination or bowel movements unless otherwise stated above. Pertinent History Reviewed:  Reviewed past medical,surgical, social, obstetrical and family history.  Reviewed problem list, medications and allergies. Physical Assessment:   Vitals:   05/08/18 1304  BP: 118/70  Pulse: 91  Weight: 200 lb 3.2 oz (90.8 kg)  Body mass index is 36.62 kg/m.        Physical Examination:   General appearance: Well appearing, and in no distress  Mental status: Alert, oriented to person, place, and time  Skin: Warm & dry  Cardiovascular: Normal heart rate noted  Respiratory: Normal respiratory effort, no distress  Abdomen: Soft, gravid, nontender  Pelvic: Cervical exam deferred         Extremities: Edema: Trace  Fetal Status: Fetal Heart Rate (bpm): 129   Movement: Present    Results for orders placed or performed in visit on 05/08/18 (from the past 24 hour(s))  POCT urinalysis dipstick   Collection Time: 05/08/18  1:08 PM  Result Value Ref Range   Color, UA     Clarity, UA     Glucose, UA Negative Negative   Bilirubin, UA     Ketones, UA 1+    Spec Grav, UA  1.010 - 1.025   Blood, UA neg    pH, UA  5.0 - 8.0   Protein, UA Negative Negative   Urobilinogen, UA  0.2 or 1.0 E.U./dL   Nitrite, UA neg    Leukocytes, UA Negative Negative   Appearance     Odor      Assessment & Plan:  1) Low-risk pregnancy [id number] at 3564w6d with an Estimated Date of Delivery: 06/06/18    Meds: No orders of the defined types were placed in this encounter.  Labs/procedures today: none  Plan:  Continue routine obstetrical care             Repeat c/s and btl next 21 July Sunday with jvf Reviewed:   Follow-up: Return for LROB. with JVF 10-14 d  Orders Placed This Encounter  Procedures  . POCT urinalysis dipstick   By signing my name below, I, Pietro CassisEmily Tufford, attest that this documentation has been prepared under the direction and in the presence of Tilda BurrowFerguson, Elsie Baynes V, MD. Electronically Signed: Pietro CassisEmily Tufford, Medical Scribe. 05/08/18. 1:48 PM.  I personally performed the services described in this documentation, which was SCRIBED in my presence. The recorded information has been reviewed and considered accurate. It has been edited as necessary during review. Tilda BurrowJohn V Georgeanna Radziewicz, MD

## 2018-05-18 ENCOUNTER — Ambulatory Visit (INDEPENDENT_AMBULATORY_CARE_PROVIDER_SITE_OTHER): Payer: Commercial Managed Care - PPO | Admitting: Obstetrics and Gynecology

## 2018-05-18 ENCOUNTER — Encounter: Payer: Self-pay | Admitting: Obstetrics and Gynecology

## 2018-05-18 VITALS — BP 121/78 | HR 96 | Wt 201.0 lb

## 2018-05-18 DIAGNOSIS — Z331 Pregnant state, incidental: Secondary | ICD-10-CM

## 2018-05-18 DIAGNOSIS — Z3483 Encounter for supervision of other normal pregnancy, third trimester: Secondary | ICD-10-CM

## 2018-05-18 DIAGNOSIS — Z3A37 37 weeks gestation of pregnancy: Secondary | ICD-10-CM

## 2018-05-18 DIAGNOSIS — Z1389 Encounter for screening for other disorder: Secondary | ICD-10-CM

## 2018-05-18 DIAGNOSIS — O34219 Maternal care for unspecified type scar from previous cesarean delivery: Secondary | ICD-10-CM

## 2018-05-18 LAB — POCT URINALYSIS DIPSTICK
Glucose, UA: NEGATIVE
KETONES UA: NEGATIVE
Leukocytes, UA: NEGATIVE
Nitrite, UA: NEGATIVE
Protein, UA: NEGATIVE
RBC UA: NEGATIVE

## 2018-05-18 NOTE — Progress Notes (Signed)
Patient ID: Tracey Reese, female   DOB: 03/23/1986, 32 y.o.   MRN: [id number]    LOW-RISK PREGNANCY VISIT Patient name: Tracey NatterJennifer R Reese MRN [id number]  Date of birth: 01/30/1986 Chief Complaint:   low risk ob (gbs- gc- chl today)  History of Present Illness:   Tracey Reese is a 32 y.o. [id number] female at 4361w2d with an Estimated Date of Delivery: 06/06/18 being seen today for ongoing management of a low-risk pregnancy prior cesarean x2 for repeat cesarean with tubal ligation already scheduled for 21 July at 8 AM (Sunday).  Today she reports no complaints. Contractions: Irregular.  .  Movement: Present. denies leaking of fluid. After her first child she had large keloids.This is her third child Review of Systems:   Pertinent items are noted in HPI Denies abnormal vaginal discharge w/ itching/odor/irritation, headaches, visual changes, shortness of breath, chest pain, abdominal pain, severe nausea/vomiting, or problems with urination or bowel movements unless otherwise stated above. Pertinent History Reviewed:  Reviewed past medical,surgical, social, obstetrical and family history.  Reviewed problem list, medications and allergies. Physical Assessment:   Vitals:   05/18/18 1049  BP: 121/78  Pulse: 96  Weight: 201 lb (91.2 kg)  Body mass index is 36.76 kg/m.        Physical Examination:   General appearance: Well appearing, and in no distress  Mental status: Alert, oriented to person, place, and time  Skin: Warm & dry  Cardiovascular: Normal heart rate noted  Respiratory: Normal respiratory effort, no distress  Abdomen: Soft, gravid, nontender  Pelvic: Cervical exam deferred    Gonorrhea and chlamydia, Group B strep and GBS specimen collected          Extremities: Edema: Trace  Fetal Status: Fetal Heart Rate (bpm): 159 Fundal Height: 35 cm Movement: Present    No results found for this or any previous visit (from the past 24 hour(s)).  Assessment & Plan:  1) Low-risk  pregnancy [id number] at 461w2d with an Estimated Date of Delivery: 06/06/18    Meds: No orders of the defined types were placed in this encounter.  Labs/procedures today: None  Plan:  Continue routine obstetrical care  Follow-up: Return in about 1 week (around 05/25/2018) for LROB.  Orders Placed This Encounter  Procedures  . Strep Gp B NAA  . GC/Chlamydia Probe Amp  . POCT urinalysis dipstick   By signing my name below, I, Arnette NorrisMari Johnson, attest that this documentation has been prepared under the direction and in the presence of Tilda BurrowFerguson, Iverson Sees V, MD. Electronically Signed: Arnette NorrisMari Johnson Medical Scribe. 05/18/18. 11:35 AM.  I personally performed the services described in this documentation, which was SCRIBED in my presence. The recorded information has been reviewed and considered accurate. It has been edited as necessary during review. Tilda BurrowJohn V Brantleigh Mifflin, MD

## 2018-05-20 ENCOUNTER — Telehealth (HOSPITAL_COMMUNITY): Payer: Self-pay | Admitting: *Deleted

## 2018-05-20 ENCOUNTER — Encounter (HOSPITAL_COMMUNITY): Payer: Self-pay

## 2018-05-20 LAB — GC/CHLAMYDIA PROBE AMP
Chlamydia trachomatis, NAA: NEGATIVE
NEISSERIA GONORRHOEAE BY PCR: NEGATIVE

## 2018-05-20 LAB — STREP GP B NAA: STREP GROUP B AG: NEGATIVE

## 2018-05-20 NOTE — Telephone Encounter (Signed)
Preadmission screen  

## 2018-05-25 ENCOUNTER — Other Ambulatory Visit: Payer: Self-pay | Admitting: Obstetrics and Gynecology

## 2018-05-25 ENCOUNTER — Other Ambulatory Visit: Payer: Self-pay

## 2018-05-25 ENCOUNTER — Ambulatory Visit (INDEPENDENT_AMBULATORY_CARE_PROVIDER_SITE_OTHER): Payer: Commercial Managed Care - PPO | Admitting: Obstetrics and Gynecology

## 2018-05-25 ENCOUNTER — Encounter: Payer: Self-pay | Admitting: Obstetrics and Gynecology

## 2018-05-25 VITALS — BP 124/71 | HR 97 | Wt 204.0 lb

## 2018-05-25 DIAGNOSIS — Z1389 Encounter for screening for other disorder: Secondary | ICD-10-CM

## 2018-05-25 DIAGNOSIS — Z331 Pregnant state, incidental: Secondary | ICD-10-CM

## 2018-05-25 DIAGNOSIS — Z3A38 38 weeks gestation of pregnancy: Secondary | ICD-10-CM | POA: Diagnosis not present

## 2018-05-25 DIAGNOSIS — Z3483 Encounter for supervision of other normal pregnancy, third trimester: Secondary | ICD-10-CM

## 2018-05-25 LAB — POCT URINALYSIS DIPSTICK
Blood, UA: NEGATIVE
GLUCOSE UA: NEGATIVE
Ketones, UA: NEGATIVE
LEUKOCYTES UA: NEGATIVE
NITRITE UA: NEGATIVE
PROTEIN UA: NEGATIVE

## 2018-05-25 NOTE — Progress Notes (Addendum)
Patient ID: Tracey NatterJennifer R Bramhall, female   DOB: 04/02/1986, 32 y.o.   MRN: [id number]    LOW-RISK PREGNANCY VISIT Patient name: Tracey NatterJennifer R Lighty MRN [id number]  Date of birth: 12/11/1985 Chief Complaint:   Routine Prenatal Visit  History of Present Illness:   Tracey Reese is a 32 y.o. [id number] female at 250w2d with an Estimated Date of Delivery: 06/06/18 being seen today for ongoing management of a low-risk pregnancy.  Today she reports no complaints. Contractions: Irregular. Vag. Bleeding: None.  Movement: Present. denies leaking of fluid. Review of Systems:   Pertinent items are noted in HPI Denies abnormal vaginal discharge w/ itching/odor/irritation, headaches, visual changes, shortness of breath, chest pain, abdominal pain, severe nausea/vomiting, or problems with urination or bowel movements unless otherwise stated above. Pertinent History Reviewed:  Reviewed past medical,surgical, social, obstetrical and family history.  Reviewed problem list, medications and allergies. Physical Assessment:   Vitals:   05/25/18 1135  BP: 124/71  Pulse: 97  Weight: 204 lb (92.5 kg)  Body mass index is 37.31 kg/m.        Physical Examination:   General appearance: Well appearing, and in no distress  Mental status: Alert, oriented to person, place, and time  Skin: Warm & dry  Cardiovascular: Normal heart rate noted  Respiratory: Normal respiratory effort, no distress  Abdomen: Soft, gravid, nontender  Pelvic: Cervical exam deferred         Extremities: Edema: Trace  Fetal Status: Fetal Heart Rate (bpm): 160-170 dopp Fundal Height: 38 cm Movement: Present    Results for orders placed or performed in visit on 05/25/18 (from the past 24 hour(s))  POCT urinalysis dipstick   Collection Time: 05/25/18 11:49 AM  Result Value Ref Range   Color, UA     Clarity, UA     Glucose, UA Negative Negative   Bilirubin, UA     Ketones, UA neg    Spec Grav, UA  1.010 - 1.025   Blood, UA neg    pH, UA   5.0 - 8.0   Protein, UA Negative Negative   Urobilinogen, UA  0.2 or 1.0 E.U./dL   Nitrite, UA neg    Leukocytes, UA Negative Negative   Appearance     Odor      Assessment & Plan:  1) Low-risk pregnancy [id number] at 6150w2d with an Estimated Date of Delivery: 06/06/18    Meds: No orders of the defined types were placed in this encounter.  Labs/procedures today: None  Plan:  1.  Repeat cesarean section and tubal ligation scheduled for 21 July at 7:30 AM 2. F/u post-partum surgery   Follow-up: Return in about 2 weeks (around 06/08/2018) for Post-Part.  Orders Placed This Encounter  Procedures  . POCT urinalysis dipstick   By signing my name below, I, Arnette NorrisMari Johnson, attest that this documentation has been prepared under the direction and in the presence of Tilda BurrowFerguson, Jozalynn Noyce V, MD. Electronically Signed: Arnette NorrisMari Johnson Medical Scribe. 05/25/18. 12:18 PM.  I personally performed the services described in this documentation, which was SCRIBED in my presence. The recorded information has been reviewed and considered accurate. It has been edited as necessary during review. Tilda BurrowJohn V Josanna Hefel, MD

## 2018-05-28 ENCOUNTER — Other Ambulatory Visit: Payer: Self-pay | Admitting: Obstetrics and Gynecology

## 2018-05-28 DIAGNOSIS — Z3A39 39 weeks gestation of pregnancy: Secondary | ICD-10-CM

## 2018-05-29 ENCOUNTER — Encounter (HOSPITAL_COMMUNITY)
Admission: RE | Admit: 2018-05-29 | Discharge: 2018-05-29 | Disposition: A | Discharge status: Home / Self Care | Payer: Commercial Managed Care - PPO | Source: Ambulatory Visit | Attending: Obstetrics and Gynecology | Admitting: Obstetrics and Gynecology

## 2018-05-29 ENCOUNTER — Inpatient Hospital Stay (HOSPITAL_COMMUNITY)
Admission: AD | Admit: 2018-05-29 | Discharge: 2018-05-29 | Disposition: A | Discharge status: Home / Self Care | Payer: Commercial Managed Care - PPO | Source: Ambulatory Visit | Attending: Obstetrics & Gynecology | Admitting: Obstetrics & Gynecology

## 2018-05-29 ENCOUNTER — Encounter (HOSPITAL_COMMUNITY): Payer: Self-pay

## 2018-05-29 DIAGNOSIS — O26899 Other specified pregnancy related conditions, unspecified trimester: Secondary | ICD-10-CM | POA: Diagnosis present

## 2018-05-29 DIAGNOSIS — Z3483 Encounter for supervision of other normal pregnancy, third trimester: Secondary | ICD-10-CM

## 2018-05-29 DIAGNOSIS — O26893 Other specified pregnancy related conditions, third trimester: Secondary | ICD-10-CM

## 2018-05-29 DIAGNOSIS — R51 Headache: Principal | ICD-10-CM

## 2018-05-29 DIAGNOSIS — O34211 Maternal care for low transverse scar from previous cesarean delivery: Secondary | ICD-10-CM | POA: Diagnosis not present

## 2018-05-29 DIAGNOSIS — R519 Headache, unspecified: Secondary | ICD-10-CM | POA: Diagnosis present

## 2018-05-29 HISTORY — DX: Adverse effect of unspecified anesthetic, initial encounter: T41.45XA

## 2018-05-29 HISTORY — DX: Other complications of anesthesia, initial encounter: T88.59XA

## 2018-05-29 HISTORY — DX: Supervision of pregnancy with other poor reproductive or obstetric history, unspecified trimester: O09.299

## 2018-05-29 LAB — COMPREHENSIVE METABOLIC PANEL
ALT: 16 U/L (ref 0–44)
AST: 21 U/L (ref 15–41)
Albumin: 3 g/dL — ABNORMAL LOW (ref 3.5–5.0)
Alkaline Phosphatase: 153 U/L — ABNORMAL HIGH (ref 38–126)
Anion gap: 11 (ref 5–15)
BILIRUBIN TOTAL: 0.4 mg/dL (ref 0.3–1.2)
BUN: 7 mg/dL (ref 6–20)
CHLORIDE: 105 mmol/L (ref 98–111)
CO2: 19 mmol/L — ABNORMAL LOW (ref 22–32)
CREATININE: 0.39 mg/dL — AB (ref 0.44–1.00)
Calcium: 8.8 mg/dL — ABNORMAL LOW (ref 8.9–10.3)
GFR calc Af Amer: >60 mL/min (ref >60)
GLUCOSE: 71 mg/dL (ref 70–99)
Potassium: 4.1 mmol/L (ref 3.5–5.1)
Sodium: 135 mmol/L (ref 135–145)
TOTAL PROTEIN: 6.6 g/dL (ref 6.5–8.1)

## 2018-05-29 LAB — PROTEIN / CREATININE RATIO, URINE
CREATININE, URINE: 24 mg/dL
Total Protein, Urine: <6 mg/dL

## 2018-05-29 LAB — CBC
HEMATOCRIT: 37.3 % (ref 36.0–46.0)
Hemoglobin: 12.2 g/dL (ref 12.0–15.0)
MCH: 29.1 pg (ref 26.0–34.0)
MCHC: 32.7 g/dL (ref 30.0–36.0)
MCV: 89 fL (ref 78.0–100.0)
Platelets: 200 10*3/uL (ref 150–400)
RBC: 4.19 MIL/uL (ref 3.87–5.11)
RDW: 13.8 % (ref 11.5–15.5)
WBC: 10.6 10*3/uL — ABNORMAL HIGH (ref 4.0–10.5)

## 2018-05-29 LAB — TYPE AND SCREEN
ABO/RH(D): A POS
Antibody Screen: NEGATIVE

## 2018-05-29 LAB — ABO/RH: ABO/RH(D): A POS

## 2018-05-29 MED ORDER — LABETALOL HCL 5 MG/ML IV SOLN: 20.0000 mg | INTRAVENOUS | Status: DC | PRN

## 2018-05-29 MED ORDER — HYDRALAZINE HCL 20 MG/ML IJ SOLN: 10.0000 mg | Freq: Once | INTRAMUSCULAR | Status: DC | PRN

## 2018-05-29 MED ORDER — ONDANSETRON HCL 4 MG/2ML IJ SOLN
4.0000 mg | Freq: Once | INTRAMUSCULAR | Status: AC
  Administered 2018-05-29: 4 mg via INTRAVENOUS
  Filled 2018-05-29: qty 2

## 2018-05-29 MED ORDER — BUTALBITAL-APAP-CAFFEINE 50-325-40 MG PO TABS
2.0000 | ORAL_TABLET | Freq: Once | ORAL | Status: AC
  Administered 2018-05-29: 2 via ORAL
  Filled 2018-05-29: qty 2

## 2018-05-29 NOTE — Patient Instructions (Signed)
Rennie NatterJennifer R Diperna  05/29/2018   Your procedure is scheduled on:  05/31/2018  [facility] through the [facility]tr[facility] of [facility] at 0600 AM.  Pick up the phone at the desk and dial [phone]  Call this number if you have problems the morning of [phone]  Remember:   Do not eat food:(After Midnight) Desps de medianoche.  Do not drink clear liquids: (After Midnight) Desps de medianoche.  Take these medicines the morning of surgery with A SIP OF WATER: welbutrin   Do not wear jewelry, make-up or nail polish.  Do not wear lotions, powders, or perfumes. Do not wear deodorant.  Do not shave 48 hours prior to surgery.  Do not bring valuables to the hospital.  [facility] is not   responsible for any belongings or valuables brought to the hospital.  Contacts, dentures or bridgework may not be worn into surgery.  Leave suitcase in the car. After surgery it may be brought to your room.  For patients admitted to the hospital, checkout time is 11:00 AM the day of              discharge.    N/A   Please read over the following fact sheets that you were given:   Surgical Site Infection Prevention

## 2018-05-29 NOTE — MAU Note (Signed)
Pt was here for pre-op testing and had c/o headache and HTN. Sent to MAU for workup.

## 2018-05-29 NOTE — Pre-Procedure Instructions (Signed)
BP 127/94 c/o headache h/o preeclampsia with last pregnancy.  Dr Penne LashLeggett notified and pt sent to  MAU for evaluation

## 2018-05-29 NOTE — MAU Provider Note (Signed)
Chief Complaint: Hypertension and Headache   First Provider Initiated Contact with Patient 05/29/18 1237      SUBJECTIVE HPI: Tracey Reese is a 32 y.o. [id number] at [redacted]w[redacted]d by LMP who presents to maternity admissions sent from preop with elevated BP. She has hx of preeclampsia in prior pregnancy but no elevated BP in this pregnancy. She reports h/a today at home so she took her BP and it was 160s/90s and 170s/90s at home with wrist cuff.  She did not try any treatments for her frontal dull constant h/a.  There were no associated symptoms.  She reports mild dull headache remains upon arrival in MAU.  She feels normal fetal movement, denies abdominal cramping, vaginal bleeding, vaginal itching/burning, urinary symptoms,  dizziness, n/v, or fever/chills.     HPI  Past Medical History:  Diagnosis Date  . Anxiety   . Bleeding 11/17/2015  . Complication of anesthesia    difficulty placing epidural  . Constipation   . Depression   . Gestational diabetes   . History of pre-eclampsia in prior pregnancy, currently pregnant   . Hypertension    during pregnancy  . Vaginal Pap smear, abnormal    Past Surgical History:  Procedure Laterality Date  . CESAREAN SECTION  2012,2015  . COLPOSCOPY  2006  . LEEP  2006  . TONSILLECTOMY AND ADENOIDECTOMY  1989  . WISDOM TOOTH EXTRACTION  2009   Social History   Socioeconomic History  . Marital status: Married    Spouse name: Not on file  . Number of children: Not on file  . Years of education: Not on file  . Highest education level: Not on file  Occupational History  . Not on file  Social Needs  . Financial resource strain: Not on file  . Food insecurity:    Worry: Not on file    Inability: Not on file  . Transportation needs:    Medical: Not on file    Non-medical: Not on file  Tobacco Use  . Smoking status: Former Smoker    Types: Cigarettes  . Smokeless tobacco: Former Neurosurgeon    Types: Chew  . Tobacco comment: quit in 2011   Substance and Sexual Activity  . Alcohol use: No    Frequency: Never    Comment: occ  . Drug use: No  . Sexual activity: Yes    Birth control/protection: None  Lifestyle  . Physical activity:    Days per week: Not on file    Minutes per session: Not on file  . Stress: Not on file  Relationships  . Social connections:    Talks on phone: Not on file    Gets together: Not on file    Attends religious service: Not on file    Active member of club or organization: Not on file    Attends meetings of clubs or organizations: Not on file    Relationship status: Not on file  . Intimate partner violence:    Fear of current or ex partner: Not on file    Emotionally abused: Not on file    Physically abused: Not on file    Forced sexual activity: Not on file  Other Topics Concern  . Not on file  Social History Narrative  . Not on file   No current facility-administered medications on file prior to encounter.    Current Outpatient Medications on File Prior to Encounter  Medication Sig Dispense Refill  . acetaminophen (TYLENOL) 325 MG tablet Take 650  mg by mouth every 6 (six) hours as needed for mild pain or headache.    Marland Kitchen aspirin EC 81 MG tablet Take 81 mg by mouth daily.    Marland Kitchen docusate sodium (COLACE) 100 MG capsule Take 100 mg by mouth daily as needed for mild constipation.    . Prenatal Multivit-Min-Fe-FA (PRENATAL VITAMINS PO) Take 1 tablet by mouth daily.      Allergies  Allergen Reactions  . Celexa [Citalopram] Other (See Comments)    Slurred speech; couldn't control body movements  . Orange Oil Rash  . Sulfa Antibiotics Rash    ROS:  Review of Systems  Constitutional: Negative for chills, fatigue and fever.  Eyes: Negative for visual disturbance.  Respiratory: Negative for shortness of breath.   Cardiovascular: Negative for chest pain.  Gastrointestinal: Negative for abdominal pain, nausea and vomiting.  Genitourinary: Negative for difficulty urinating, dysuria, flank  pain, pelvic pain, vaginal bleeding, vaginal discharge and vaginal pain.  Neurological: Positive for headaches. Negative for dizziness.  Psychiatric/Behavioral: Negative.      I have reviewed patient's Past Medical Hx, Surgical Hx, Family Hx, Social Hx, medications and allergies.   Physical Exam   Patient Vitals for the past 24 hrs:  BP Temp Temp src Pulse Resp Weight  05/29/18 1507 - - - - 16 -  05/29/18 1230 117/82 - - 99 - -  05/29/18 1215 128/88 - - (!) 102 - -  05/29/18 1205 135/89 98.1 F (36.7 C) Oral 97 16 -  05/29/18 1155 - - - - - 199 lb (90.3 kg)   Constitutional: Well-developed, well-nourished female in no acute distress.  Cardiovascular: normal rate Respiratory: normal effort GI: Abd soft, non-tender. Pos BS x 4 MS: Extremities nontender, no edema, normal ROM Neurologic: Alert and oriented x 4.  GU: Neg CVAT.   FHT baseline 135 with positive accels, no decels and moderate variability Toco with rare contractions, mild to palpation  LAB RESULTS Results for orders placed or performed during the hospital encounter of 05/29/18 (from the past 24 hour(s))  Comprehensive metabolic panel     Status: Abnormal   Collection Time: 05/29/18 12:18 PM  Result Value Ref Range   Sodium 135 135 - 145 mmol/L   Potassium 4.1 3.5 - 5.1 mmol/L   Chloride 105 98 - 111 mmol/L   CO2 19 (L) 22 - 32 mmol/L   Glucose, Bld 71 70 - 99 mg/dL   BUN 7 6 - 20 mg/dL   Creatinine, Ser 1.61 (L) 0.44 - 1.00 mg/dL   Calcium 8.8 (L) 8.9 - 10.3 mg/dL   Total Protein 6.6 6.5 - 8.1 g/dL   Albumin 3.0 (L) 3.5 - 5.0 g/dL   AST 21 15 - 41 U/L   ALT 16 0 - 44 U/L   Alkaline Phosphatase 153 (H) 38 - 126 U/L   Total Bilirubin 0.4 0.3 - 1.2 mg/dL   GFR calc non Af Amer >60 >60 mL/min   GFR calc Af Amer >60 >60 mL/min   Anion gap 11 5 - 15  ABO/Rh     Status: None   Collection Time: 05/29/18 12:18 PM  Result Value Ref Range   ABO/RH(D)      A POS Performed at [facility], [address] [place], [place] [address]   Protein / creatinine ratio, urine     Status: None   Collection Time: 05/29/18 12:21 PM  Result Value Ref Range   Creatinine, Urine 24.00 mg/dL   Total Protein, Urine <  6 mg/dL   Protein Creatinine Ratio        0.00 - 0.15 mg/mg[Cre]    --/--/A POS, A POS Performed at [facility], [address] [place], [place] [address]  (07/19 1218)  IMAGING No results found.  MAU Management/MDM: NST reviewed and reactive BP normal in MAU but with pt hx preeclampsia labs obtained Preeclampsia labs wnl, with P/C ratio too low to measure Fioricet x 2 given in MAU with complete resolution of h/a D/C home with BP check next week Return to MAU with s/sx of preeclampsia or labor Keep scheduled C/S and appts in office Pt discharged with strict preeclampsia precautions.  ASSESSMENT 1. Pregnancy headache in third trimester   2. Encounter for supervision of other normal pregnancy in third trimester     PLAN Discharge home Allergies as of 05/29/2018      Reactions   Celexa [citalopram] Other (See Comments)   Slurred speech; couldn't control body movements   Orange Oil Rash   Sulfa Antibiotics Rash      Medication List    STOP taking these medications   ALPRAZolam 0.25 MG tablet Commonly known as:  XANAX   buPROPion 150 MG 12 hr tablet Commonly known as:  WELLBUTRIN SR     TAKE these medications   acetaminophen 325 MG tablet Commonly known as:  TYLENOL Take 650 mg by mouth every 6 (six) hours as needed for mild pain or headache.   aspirin EC 81 MG tablet Take 81 mg by mouth daily.   docusate sodium 100 MG capsule Commonly known as:  COLACE Take 100 mg by mouth daily as needed for mild constipation.   PRENATAL VITAMINS PO Take 1 tablet by mouth daily.      Follow-up Information    Dr. Christin BachJohn Ferguson. Go on 05/31/2018.   Why:  For scheduled C-section as scheduled Contact information: [facility] [address] [place], [place] [address]          Sharen CounterLisa Leftwich-Kirby Certified Nurse-Midwife 05/29/2018  10:55 PM

## 2018-05-29 NOTE — Discharge Instructions (Signed)
You may take Tylenol 1000 mg every 6 hrs as needed for pain °

## 2018-05-30 LAB — RPR: RPR Ser Ql: NONREACTIVE

## 2018-05-31 ENCOUNTER — Encounter (HOSPITAL_COMMUNITY)
Admission: RE | Disposition: A | Discharge status: Home / Self Care | Payer: Self-pay | Source: Home / Self Care | Attending: Obstetrics and Gynecology

## 2018-05-31 ENCOUNTER — Encounter (HOSPITAL_COMMUNITY): Payer: Self-pay

## 2018-05-31 ENCOUNTER — Inpatient Hospital Stay (HOSPITAL_COMMUNITY)
Admission: RE | Admit: 2018-05-31 | Discharge: 2018-06-02 | DRG: 785 | Disposition: A | Discharge status: Home / Self Care | Payer: Commercial Managed Care - PPO | Attending: Obstetrics and Gynecology | Admitting: Obstetrics and Gynecology

## 2018-05-31 ENCOUNTER — Other Ambulatory Visit: Payer: Self-pay

## 2018-05-31 ENCOUNTER — Inpatient Hospital Stay (HOSPITAL_COMMUNITY): Payer: Commercial Managed Care - PPO | Admitting: Certified Registered Nurse Anesthetist

## 2018-05-31 DIAGNOSIS — O99214 Obesity complicating childbirth: Secondary | ICD-10-CM | POA: Diagnosis present

## 2018-05-31 DIAGNOSIS — Z87891 Personal history of nicotine dependence: Secondary | ICD-10-CM

## 2018-05-31 DIAGNOSIS — O34211 Maternal care for low transverse scar from previous cesarean delivery: Principal | ICD-10-CM | POA: Diagnosis present

## 2018-05-31 DIAGNOSIS — Z8632 Personal history of gestational diabetes: Secondary | ICD-10-CM | POA: Diagnosis not present

## 2018-05-31 DIAGNOSIS — O09299 Supervision of pregnancy with other poor reproductive or obstetric history, unspecified trimester: Secondary | ICD-10-CM

## 2018-05-31 DIAGNOSIS — Z98891 History of uterine scar from previous surgery: Secondary | ICD-10-CM

## 2018-05-31 DIAGNOSIS — Z302 Encounter for sterilization: Secondary | ICD-10-CM

## 2018-05-31 DIAGNOSIS — Z86718 Personal history of other venous thrombosis and embolism: Secondary | ICD-10-CM

## 2018-05-31 DIAGNOSIS — Z3A39 39 weeks gestation of pregnancy: Secondary | ICD-10-CM

## 2018-05-31 DIAGNOSIS — F418 Other specified anxiety disorders: Secondary | ICD-10-CM

## 2018-05-31 LAB — URINALYSIS, ROUTINE W REFLEX MICROSCOPIC
BILIRUBIN URINE: NEGATIVE
Glucose, UA: NEGATIVE mg/dL
HGB URINE DIPSTICK: NEGATIVE
Ketones, ur: NEGATIVE mg/dL
Nitrite: NEGATIVE
PROTEIN: NEGATIVE mg/dL
Specific Gravity, Urine: 1.009 (ref 1.005–1.030)
pH: 6 (ref 5.0–8.0)

## 2018-05-31 LAB — CREATININE, SERUM
CREATININE: 0.48 mg/dL (ref 0.44–1.00)
GFR calc non Af Amer: >60 mL/min (ref >60)

## 2018-05-31 LAB — CBC
HCT: 36.3 % (ref 36.0–46.0)
Hemoglobin: 12.1 g/dL (ref 12.0–15.0)
MCH: 29.7 pg (ref 26.0–34.0)
MCHC: 33.3 g/dL (ref 30.0–36.0)
MCV: 89.2 fL (ref 78.0–100.0)
Platelets: 178 10*3/uL (ref 150–400)
RBC: 4.07 MIL/uL (ref 3.87–5.11)
RDW: 13.6 % (ref 11.5–15.5)
WBC: 15.3 10*3/uL — AB (ref 4.0–10.5)

## 2018-05-31 SURGERY — Surgical Case
Anesthesia: Spinal | Site: Abdomen | Wound class: Clean Contaminated

## 2018-05-31 MED ORDER — SCOPOLAMINE 1 MG/3DAYS TD PT72
MEDICATED_PATCH | TRANSDERMAL | Status: AC
  Filled 2018-05-31: qty 1

## 2018-05-31 MED ORDER — OXYTOCIN 10 UNIT/ML IJ SOLN
INTRAMUSCULAR | Status: AC
  Filled 2018-05-31: qty 4

## 2018-05-31 MED ORDER — TETANUS-DIPHTH-ACELL PERTUSSIS 5-2.5-18.5 LF-MCG/0.5 IM SUSP: 0.5000 mL | Freq: Once | INTRAMUSCULAR | Status: DC

## 2018-05-31 MED ORDER — ONDANSETRON HCL 4 MG/2ML IJ SOLN
INTRAMUSCULAR | Status: AC
  Filled 2018-05-31: qty 2

## 2018-05-31 MED ORDER — DEXAMETHASONE SODIUM PHOSPHATE 10 MG/ML IJ SOLN
INTRAMUSCULAR | Status: DC | PRN
  Administered 2018-05-31: 10 mg via INTRAVENOUS

## 2018-05-31 MED ORDER — DIPHENHYDRAMINE HCL 50 MG/ML IJ SOLN: 12.5000 mg | INTRAMUSCULAR | Status: DC | PRN

## 2018-05-31 MED ORDER — PHENYLEPHRINE 8 MG IN D5W 100 ML (0.08MG/ML) PREMIX OPTIME
INJECTION | INTRAVENOUS | Status: AC
  Filled 2018-05-31: qty 100

## 2018-05-31 MED ORDER — SODIUM CHLORIDE 0.9% FLUSH: 3.0000 mL | INTRAVENOUS | Status: DC | PRN

## 2018-05-31 MED ORDER — EPHEDRINE SULFATE 50 MG/ML IJ SOLN
INTRAMUSCULAR | Status: DC | PRN
  Administered 2018-05-31: 20 mg via INTRAVENOUS

## 2018-05-31 MED ORDER — SIMETHICONE 80 MG PO CHEW: 80.0000 mg | CHEWABLE_TABLET | ORAL | Status: DC | PRN

## 2018-05-31 MED ORDER — LACTATED RINGERS IV SOLN: INTRAVENOUS | Status: DC | PRN

## 2018-05-31 MED ORDER — KETOROLAC TROMETHAMINE 30 MG/ML IJ SOLN: 30.0000 mg | Freq: Four times a day (QID) | INTRAMUSCULAR | Status: AC | PRN

## 2018-05-31 MED ORDER — SIMETHICONE 80 MG PO CHEW
80.0000 mg | CHEWABLE_TABLET | Freq: Three times a day (TID) | ORAL | Status: DC
  Administered 2018-05-31 – 2018-06-02 (×6): 80 mg via ORAL
  Filled 2018-05-31 (×6): qty 1

## 2018-05-31 MED ORDER — NALOXONE HCL 0.4 MG/ML IJ SOLN: 0.4000 mg | INTRAMUSCULAR | Status: DC | PRN

## 2018-05-31 MED ORDER — MORPHINE SULFATE (PF) 0.5 MG/ML IJ SOLN
INTRAMUSCULAR | Status: AC
  Filled 2018-05-31: qty 10

## 2018-05-31 MED ORDER — PHENYLEPHRINE 8 MG IN D5W 100 ML (0.08MG/ML) PREMIX OPTIME
INJECTION | INTRAVENOUS | Status: DC | PRN
  Administered 2018-05-31: 60 ug/min via INTRAVENOUS

## 2018-05-31 MED ORDER — WITCH HAZEL-GLYCERIN EX PADS: 1.0000 "application " | MEDICATED_PAD | CUTANEOUS | Status: DC | PRN

## 2018-05-31 MED ORDER — MORPHINE SULFATE (PF) 0.5 MG/ML IJ SOLN
INTRAMUSCULAR | Status: DC | PRN
  Administered 2018-05-31: .2 mg via INTRATHECAL

## 2018-05-31 MED ORDER — KETOROLAC TROMETHAMINE 30 MG/ML IJ SOLN
INTRAMUSCULAR | Status: AC
  Filled 2018-05-31: qty 1

## 2018-05-31 MED ORDER — FENTANYL CITRATE (PF) 100 MCG/2ML IJ SOLN
INTRAMUSCULAR | Status: AC
  Filled 2018-05-31: qty 2

## 2018-05-31 MED ORDER — ONDANSETRON HCL 4 MG/2ML IJ SOLN: 4.0000 mg | Freq: Three times a day (TID) | INTRAMUSCULAR | Status: DC | PRN

## 2018-05-31 MED ORDER — ZOLPIDEM TARTRATE 5 MG PO TABS: 5.0000 mg | ORAL_TABLET | Freq: Every evening | ORAL | Status: DC | PRN

## 2018-05-31 MED ORDER — ONDANSETRON HCL 4 MG/2ML IJ SOLN
INTRAMUSCULAR | Status: DC | PRN
  Administered 2018-05-31: 4 mg via INTRAVENOUS

## 2018-05-31 MED ORDER — SODIUM CHLORIDE 0.9 % IR SOLN
Status: DC | PRN
  Administered 2018-05-31: 1

## 2018-05-31 MED ORDER — LACTATED RINGERS IV SOLN
INTRAVENOUS | Status: DC | PRN
  Administered 2018-05-31: 10:00:00 via INTRAVENOUS

## 2018-05-31 MED ORDER — PRENATAL MULTIVITAMIN CH
1.0000 | ORAL_TABLET | Freq: Every day | ORAL | Status: DC
  Administered 2018-06-01 – 2018-06-02 (×2): 1 via ORAL
  Filled 2018-05-31 (×2): qty 1

## 2018-05-31 MED ORDER — ACETAMINOPHEN 325 MG PO TABS
650.0000 mg | ORAL_TABLET | ORAL | Status: DC | PRN
  Administered 2018-05-31: 650 mg via ORAL
  Filled 2018-05-31: qty 2

## 2018-05-31 MED ORDER — NALBUPHINE HCL 10 MG/ML IJ SOLN: 5.0000 mg | Freq: Once | INTRAMUSCULAR | Status: DC | PRN

## 2018-05-31 MED ORDER — OXYCODONE-ACETAMINOPHEN 5-325 MG PO TABS
1.0000 | ORAL_TABLET | Freq: Four times a day (QID) | ORAL | Status: DC | PRN
  Administered 2018-06-01 (×5): 1 via ORAL
  Filled 2018-05-31 (×5): qty 1

## 2018-05-31 MED ORDER — DIPHENHYDRAMINE HCL 25 MG PO CAPS
25.0000 mg | ORAL_CAPSULE | ORAL | Status: DC | PRN
  Filled 2018-05-31: qty 1

## 2018-05-31 MED ORDER — DIBUCAINE 1 % RE OINT: 1.0000 "application " | TOPICAL_OINTMENT | RECTAL | Status: DC | PRN

## 2018-05-31 MED ORDER — IBUPROFEN 600 MG PO TABS
600.0000 mg | ORAL_TABLET | Freq: Four times a day (QID) | ORAL | Status: DC
  Administered 2018-05-31 – 2018-06-02 (×8): 600 mg via ORAL
  Filled 2018-05-31 (×8): qty 1

## 2018-05-31 MED ORDER — SCOPOLAMINE 1 MG/3DAYS TD PT72
MEDICATED_PATCH | TRANSDERMAL | Status: DC | PRN
  Administered 2018-05-31: 1 via TRANSDERMAL

## 2018-05-31 MED ORDER — LACTATED RINGERS IV SOLN: INTRAVENOUS | Status: DC

## 2018-05-31 MED ORDER — KETOROLAC TROMETHAMINE 30 MG/ML IJ SOLN
30.0000 mg | Freq: Four times a day (QID) | INTRAMUSCULAR | Status: AC | PRN
  Administered 2018-05-31: 30 mg via INTRAVENOUS

## 2018-05-31 MED ORDER — NALBUPHINE HCL 10 MG/ML IJ SOLN: 5.0000 mg | INTRAMUSCULAR | Status: DC | PRN

## 2018-05-31 MED ORDER — OXYTOCIN 10 UNIT/ML IJ SOLN
INTRAVENOUS | Status: DC | PRN
  Administered 2018-05-31: 40 [IU] via INTRAVENOUS

## 2018-05-31 MED ORDER — OXYCODONE-ACETAMINOPHEN 5-325 MG PO TABS
2.0000 | ORAL_TABLET | Freq: Four times a day (QID) | ORAL | Status: DC | PRN
  Administered 2018-06-01 – 2018-06-02 (×2): 2 via ORAL
  Filled 2018-05-31 (×2): qty 2

## 2018-05-31 MED ORDER — SIMETHICONE 80 MG PO CHEW
80.0000 mg | CHEWABLE_TABLET | ORAL | Status: DC
  Administered 2018-06-01 (×2): 80 mg via ORAL
  Filled 2018-05-31 (×2): qty 1

## 2018-05-31 MED ORDER — CEFAZOLIN SODIUM-DEXTROSE 2-4 GM/100ML-% IV SOLN
2.0000 g | INTRAVENOUS | Status: AC
  Administered 2018-05-31: 2 g via INTRAVENOUS

## 2018-05-31 MED ORDER — EPHEDRINE 5 MG/ML INJ
INTRAVENOUS | Status: AC
  Filled 2018-05-31: qty 10

## 2018-05-31 MED ORDER — FENTANYL CITRATE (PF) 100 MCG/2ML IJ SOLN
INTRAMUSCULAR | Status: DC | PRN
  Administered 2018-05-31: 20 ug via INTRATHECAL

## 2018-05-31 MED ORDER — SENNOSIDES-DOCUSATE SODIUM 8.6-50 MG PO TABS
2.0000 | ORAL_TABLET | ORAL | Status: DC
  Administered 2018-06-01 (×2): 2 via ORAL
  Filled 2018-05-31 (×2): qty 2

## 2018-05-31 MED ORDER — FENTANYL CITRATE (PF) 100 MCG/2ML IJ SOLN: 25.0000 ug | INTRAMUSCULAR | Status: DC | PRN

## 2018-05-31 MED ORDER — NALOXONE HCL 4 MG/10ML IJ SOLN
1.0000 ug/kg/h | INTRAVENOUS | Status: DC | PRN
  Filled 2018-05-31: qty 5

## 2018-05-31 MED ORDER — DIPHENHYDRAMINE HCL 25 MG PO CAPS: 25.0000 mg | ORAL_CAPSULE | Freq: Four times a day (QID) | ORAL | Status: DC | PRN

## 2018-05-31 MED ORDER — OXYTOCIN 40 UNITS IN LACTATED RINGERS INFUSION - SIMPLE MED: 2.5000 [IU]/h | INTRAVENOUS | Status: AC

## 2018-05-31 MED ORDER — DEXAMETHASONE SODIUM PHOSPHATE 10 MG/ML IJ SOLN
INTRAMUSCULAR | Status: AC
  Filled 2018-05-31: qty 1

## 2018-05-31 MED ORDER — ENOXAPARIN SODIUM 40 MG/0.4ML ~~LOC~~ SOLN
40.0000 mg | SUBCUTANEOUS | Status: DC
  Administered 2018-06-01 – 2018-06-02 (×2): 40 mg via SUBCUTANEOUS
  Filled 2018-05-31 (×2): qty 0.4

## 2018-05-31 MED ORDER — MEPERIDINE HCL 25 MG/ML IJ SOLN: 6.2500 mg | INTRAMUSCULAR | Status: DC | PRN

## 2018-05-31 MED ORDER — COCONUT OIL OIL
1.0000 "application " | TOPICAL_OIL | Status: DC | PRN
  Administered 2018-06-01: 1 via TOPICAL
  Filled 2018-05-31: qty 120

## 2018-05-31 MED ORDER — BUPIVACAINE IN DEXTROSE 0.75-8.25 % IT SOLN
INTRATHECAL | Status: DC | PRN
  Administered 2018-05-31: 1.6 mL via INTRATHECAL

## 2018-05-31 MED ORDER — MENTHOL 3 MG MT LOZG: 1.0000 | LOZENGE | OROMUCOSAL | Status: DC | PRN

## 2018-05-31 MED ORDER — LACTATED RINGERS IV SOLN
INTRAVENOUS | Status: DC | PRN
  Administered 2018-05-31 (×2): via INTRAVENOUS

## 2018-05-31 SURGICAL SUPPLY — 38 items
BENZOIN TINCTURE PRP APPL 2/3 (GAUZE/BANDAGES/DRESSINGS) ×3 IMPLANT
CLAMP CORD UMBIL (MISCELLANEOUS) IMPLANT
CLIP FILSHIE TUBAL LIGA STRL (Clip) ×3 IMPLANT
CLOSURE STERI STRIP 1/2 X4 (GAUZE/BANDAGES/DRESSINGS) ×6 IMPLANT
CLOSURE WOUND 1/2 X4 (GAUZE/BANDAGES/DRESSINGS)
CLOTH BEACON ORANGE TIMEOUT ST (SAFETY) ×3 IMPLANT
DRSG OPSITE POSTOP 4X10 (GAUZE/BANDAGES/DRESSINGS) ×3 IMPLANT
DURAPREP 26ML APPLICATOR (WOUND CARE) ×3 IMPLANT
ELECT REM PT RETURN 9FT ADLT (ELECTROSURGICAL) ×3
ELECTRODE REM PT RTRN 9FT ADLT (ELECTROSURGICAL) ×1 IMPLANT
EXTRACTOR VACUUM KIWI (MISCELLANEOUS) IMPLANT
GAUZE SPONGE 4X4 12PLY STRL LF (GAUZE/BANDAGES/DRESSINGS) ×6 IMPLANT
GLOVE BIOGEL PI IND STRL 7.0 (GLOVE) ×1 IMPLANT
GLOVE BIOGEL PI IND STRL 9 (GLOVE) ×1 IMPLANT
GLOVE BIOGEL PI INDICATOR 7.0 (GLOVE) ×2
GLOVE BIOGEL PI INDICATOR 9 (GLOVE) ×2
GLOVE SS PI 9.0 STRL (GLOVE) ×3 IMPLANT
GOWN STRL REUS W/TWL 2XL LVL3 (GOWN DISPOSABLE) ×3 IMPLANT
GOWN STRL REUS W/TWL LRG LVL3 (GOWN DISPOSABLE) ×6 IMPLANT
NEEDLE HYPO 25X5/8 SAFETYGLIDE (NEEDLE) IMPLANT
NS IRRIG 1000ML POUR BTL (IV SOLUTION) ×3 IMPLANT
PACK C SECTION WH (CUSTOM PROCEDURE TRAY) ×3 IMPLANT
PAD ABD 7.5X8 STRL (GAUZE/BANDAGES/DRESSINGS) ×3 IMPLANT
PAD OB MATERNITY 4.3X12.25 (PERSONAL CARE ITEMS) ×3 IMPLANT
PENCIL SMOKE EVAC W/HOLSTER (ELECTROSURGICAL) ×3 IMPLANT
RTRCTR C-SECT PINK 25CM LRG (MISCELLANEOUS) IMPLANT
RTRCTR C-SECT PINK 34CM XLRG (MISCELLANEOUS) IMPLANT
STRIP CLOSURE SKIN 1/2X4 (GAUZE/BANDAGES/DRESSINGS) IMPLANT
SUT MNCRL 0 VIOLET CTX 36 (SUTURE) ×2 IMPLANT
SUT MONOCRYL 0 CTX 36 (SUTURE) ×4
SUT VIC AB 0 CT1 27 (SUTURE) ×2
SUT VIC AB 0 CT1 27XBRD ANBCTR (SUTURE) ×1 IMPLANT
SUT VIC AB 2-0 CT1 27 (SUTURE) ×4
SUT VIC AB 2-0 CT1 TAPERPNT 27 (SUTURE) ×2 IMPLANT
SUT VIC AB 4-0 KS 27 (SUTURE) ×3 IMPLANT
SYR BULB IRRIGATION 50ML (SYRINGE) IMPLANT
TOWEL OR 17X24 6PK STRL BLUE (TOWEL DISPOSABLE) ×3 IMPLANT
TRAY FOLEY W/BAG SLVR 14FR LF (SET/KITS/TRAYS/PACK) ×3 IMPLANT

## 2018-05-31 NOTE — Anesthesia Postprocedure Evaluation (Signed)
Anesthesia Post Note  Patient: Rennie NatterJennifer R Falkner  Procedure(s) Performed: CESAREAN SECTION WITH BILATERAL TUBAL LIGATION (N/A Abdomen)     Patient location during evaluation: PACU Anesthesia Type: Spinal Level of consciousness: oriented and awake and alert Pain management: pain level controlled Vital Signs Assessment: post-procedure vital signs reviewed and stable Respiratory status: spontaneous breathing, respiratory function stable and nonlabored ventilation Cardiovascular status: blood pressure returned to baseline and stable Postop Assessment: no headache, no backache, no apparent nausea or vomiting, patient able to bend at knees and spinal receding Anesthetic complications: no    Last Vitals:  Vitals:   05/31/18 1148 05/31/18 1149  BP:    Pulse:  94  Resp: 17 16  Temp:    SpO2:  99%    Last Pain:  Vitals:   05/31/18 0637  TempSrc: Oral  PainSc: 0-No pain   Pain Goal:                 Camil Hausmann A.

## 2018-05-31 NOTE — Anesthesia Preprocedure Evaluation (Signed)
Anesthesia Evaluation  Patient identified by MRN, date of birth, ID band Patient awake  General Assessment Comment:Hx/o difficulty with placement of epidural catheter  Reviewed: Allergy & Precautions, NPO status , Patient's Chart, lab work & pertinent test results  History of Anesthesia Complications (+) history of anesthetic complications  Airway Mallampati: II  TM Distance: >3 FB Neck ROM: Full    Dental no notable dental hx. (+) Teeth Intact   Pulmonary former smoker,    Pulmonary exam normal breath sounds clear to auscultation       Cardiovascular hypertension, + Peripheral Vascular Disease  Normal cardiovascular exam Rhythm:Regular Rate:Normal  Hx/o pre eclampsia with previous pregnancy   Neuro/Psych  Headaches, PSYCHIATRIC DISORDERS Anxiety Depression    GI/Hepatic Neg liver ROS, GERD  ,  Endo/Other  diabetes, GestationalMorbid obesityHx/o GDM with prior pregnancy Obesity  Renal/GU negative Renal ROS  negative genitourinary   Musculoskeletal negative musculoskeletal ROS (+)   Abdominal (+) + obese,   Peds  Hematology negative hematology ROS (+)   Anesthesia Other Findings   Reproductive/Obstetrics (+) Pregnancy Previous C/Section x 2                             Anesthesia Physical Anesthesia Plan  ASA: II  Anesthesia Plan: Spinal   Post-op Pain Management:    Induction:   PONV Risk Score and Plan: 4 or greater and Scopolamine patch - Pre-op, Dexamethasone, Ondansetron and Treatment may vary due to age or medical condition  Airway Management Planned: Natural Airway  Additional Equipment:   Intra-op Plan:   Post-operative Plan:   Informed Consent: I have reviewed the patients History and Physical, chart, labs and discussed the procedure including the risks, benefits and alternatives for the proposed anesthesia with the patient or authorized representative who has  indicated his/her understanding and acceptance.   Dental advisory given  Plan Discussed with: CRNA and Surgeon  Anesthesia Plan Comments:         Anesthesia Quick Evaluation

## 2018-05-31 NOTE — Progress Notes (Signed)
Mother of baby was referred for history of depression and anxiety. Referral screened out by CSW because per chart review, MOB is actively taking .25mg  Xanax once daily and 150mg  of Wellbutrin SR twice daily to address her symptoms.    Please contact CSW if mother of baby requests, if needs arise, or if mother of baby scores greater than a nine or answers yes to question ten on Edinburgh Postpartum Depression Screen.   Tracey Reese, MSW, LCSW-A Clinical Social Worker [facility] [phone]

## 2018-05-31 NOTE — Anesthesia Postprocedure Evaluation (Signed)
Anesthesia Post Note  Patient: Tracey Reese  Procedure(s) Performed: CESAREAN SECTION WITH BILATERAL TUBAL LIGATION (N/A Abdomen)     Patient location during evaluation: Mother Baby Anesthesia Type: Spinal Level of consciousness: awake Pain management: satisfactory to patient Vital Signs Assessment: post-procedure vital signs reviewed and stable Respiratory status: spontaneous breathing Cardiovascular status: stable Anesthetic complications: no    Last Vitals:  Vitals:   05/31/18 1430 05/31/18 1530  BP: 123/86 117/81  Pulse: 62 83  Resp: 18 18  Temp: (!) 36.4 C 36.6 C  SpO2: 100% 100%    Last Pain:  Vitals:   05/31/18 1530  TempSrc: Oral  PainSc: 3    Pain Goal: Patients Stated Pain Goal: 3 (05/31/18 1530)               Cephus ShellingBURGER,Aryn Kops

## 2018-05-31 NOTE — Anesthesia Procedure Notes (Signed)
Spinal  Patient location during procedure: OR Start time: 05/31/2018 9:48 AM Staffing Anesthesiologist: Mal AmabileFoster, Shambhavi Salley, MD Performed: anesthesiologist  Preanesthetic Checklist Completed: patient identified, site marked, surgical consent, pre-op evaluation, timeout performed, IV checked, risks and benefits discussed and monitors and equipment checked Spinal Block Patient position: sitting Prep: site prepped and draped and DuraPrep Patient monitoring: heart rate, cardiac monitor, continuous pulse ox and blood pressure Approach: midline Location: L3-4 Injection technique: single-shot Needle Needle type: Pencan  Needle gauge: 24 G Needle length: 9 cm Needle insertion depth: 7 cm Assessment Sensory level: T4 Additional Notes Patient tolerated procedure well. Adequate sensory level.

## 2018-05-31 NOTE — H&P (Signed)
Tracey NatterJennifer R Reese is a 32 y.o. female [phone]  at 6845w1d presenting for repeat cesarean section and tubal ligation.. She has been counselled over procedure, risks of complications, such as bleeding, infection, injury to bladder or other organs, and has signed consents. Permanent sterilization desired, Pt 's husband had a vasectomy 9 months ago. OB History    Gravida  7   Para  2   Term  2   Preterm      AB  4   Living  2     SAB  4   TAB      Ectopic      Multiple      Live Births  2          Past Medical History:  Diagnosis Date  . Anxiety   . Bleeding 11/17/2015  . Complication of anesthesia    difficulty placing epidural  . Constipation   . Depression   . Gestational diabetes    with first pregnancy  . History of pre-eclampsia in prior pregnancy, currently pregnant   . Hypertension    during second pregnancy  . Vaginal Pap smear, abnormal    Past Surgical History:  Procedure Laterality Date  . CESAREAN SECTION  2012,2015  . COLPOSCOPY  2006  . LEEP  2006  . TONSILLECTOMY AND ADENOIDECTOMY  1989  . WISDOM TOOTH EXTRACTION  2009   Family History: family history includes Cancer in her mother and paternal grandmother; Diabetes in her mother; Hyperlipidemia in her maternal grandfather; Other in her maternal grandfather; Parkinson's disease in her paternal grandmother; Syncope episode in her sister. Social History:  reports that she has quit smoking. Her smoking use included cigarettes. She has quit using smokeless tobacco. Her smokeless tobacco use included chew. She reports that she does not drink alcohol or use drugs.     Maternal Diabetes: No Genetic Screening: Normal Maternal Ultrasounds/Referrals: Normal Fetal Ultrasounds or other Referrals:  None Maternal Substance Abuse:  No Significant Maternal Medications:  None Significant Maternal Lab Results:  None Other Comments:  None  ROS History   Blood pressure 124/82, pulse 97, temperature 97.6 F  (36.4 C), temperature source Oral, resp. rate 16, height 5\' 2"  (1.575 m), weight 202 lb 8 oz (91.9 kg), last menstrual period 08/30/2017. Exam Physical Exam  Constitutional: She is oriented to person, place, and time. She appears well-developed and well-nourished.  HENT:  Head: Normocephalic.  Eyes: Pupils are equal, round, and reactive to light.  Neck: Normal range of motion.  Cardiovascular: Normal rate.  Respiratory: Effort normal.  GI: Soft.  Well healed scar , low  On abdomen, tatoo on RLQ. EFW 8+ 0. Vertex.  Musculoskeletal: Normal range of motion.  Neurological: She is alert and oriented to person, place, and time.    CBC    Component Value Date/Time   WBC 10.6 (H) 05/29/2018 1218   RBC 4.19 05/29/2018 1218   HGB 12.2 05/29/2018 1218   HGB 12.6 03/11/2018 0910   HCT 37.3 05/29/2018 1218   HCT 37.9 03/11/2018 0910   PLT 200 05/29/2018 1218   PLT 214 03/11/2018 0910   MCV 89.0 05/29/2018 1218   MCV 90 03/11/2018 0910   MCH 29.1 05/29/2018 1218   MCHC 32.7 05/29/2018 1218   RDW 13.8 05/29/2018 1218   RDW 13.9 03/11/2018 0910   LYMPHSABS 1.9 11/06/2017 1128   EOSABS 0.1 11/06/2017 1128   BASOSABS 0.0 11/06/2017 1128    Prenatal labs: ABO, Rh: --/--/A POS, A POS  Performed at [facility], [address] [place], [place] [address]  [phone] 1218) Antibody: NEG (07/19 1218) Rubella: 1.75 (12/27 1128) RPR: Non Reactive (07/19 1218)  HBsAg: Negative (12/27 1128)  HIV: Non Reactive (05/01 0910)  GBS: Negative (07/08 1200)   Assessment/Plan: Pregnancy 39+1 wk  Prior c/s for repeat  Desire for permanent sterilization.  Repeat cs and btl this am Second case.   Tilda Burrow 05/31/2018, 7:55 AM

## 2018-05-31 NOTE — Consult Note (Signed)
Neonatology Note:   Attendance at C-section:    I was asked by Dr. Emelda FearFerguson to attend this repeat C/S at term. The mother is a G7P2A4 A pos, GBS neg with a history of anxiety. ROM at delivery, fluid clear. Infant vigorous with good spontaneous cry and tone. Delayed cord clamping was done. Needed no suctioning. Ap 9/9. Lungs clear to ausc in DR. Infant is able to remain with his mother for skin to skin time under nursing supervision. Transferred to the care of Pediatrician.   Tracey Souhristie C. [name], MD

## 2018-05-31 NOTE — Op Note (Signed)
Preop: Pregnancy 39 weeks 1 day prior cesarean x2 not for trial of labor elective permanent sterilization Postop pregnancy 39 weeks 1 day delivered prior cesarean x2 not for trial of labor elective permanent sterilization, excision of old cicatrix (no charge) Procedure: Repeat low transverse cervical cesarean section bilateral tubal sterilization by Filshie clip placement  Surgeon Christin BachJohn Dorothia Passmore, MD Assistant:None Anesthesia spinal Complications: None EBL: 500 cc Findings: Healthy infant, normal uterus thin lower uterine segment Indications: Prior cesarean x2 elective permanent sterilization  Details of procedure: patient was taken the operating room prepped and draped for lower abdominal surgery, Timeout was conducted Ancef administered procedure confirmed by surgical team transverse lower abdominal incision was quite low on the abdomen 2 cm below the lower abdominal crease.  Old cicatrix was removed as per patient discussion.  The entry incision was made just below the old cicatrix, dissected upward and transverse lower abdominal incision extended down to the fascia which was opened transversely and the peritoneal cavity opened in the midline without difficulty.  There was some omental adhesions to the anterior abdominal wall at the old C-section scar which were dissected free.  Alexis wound retractor was positioned and transverse uterine incision performed through a small bladder flap site and the transverse uterine incision opened transversely and then extended cephalad and caudad with index finger traction.  Fetal vertex was rotated into the incision and the baby delivered without difficulty.  Cord was clamped at 1 minute.  Cord blood samples obtained.  The placenta delivered easily in response to  uterine massage  Brains were intact.  The uterus was swabbed out and was clean.  Uterus was closed in a continuous running single layer running locking closure with good hemostasis.  A second layer was  not necessary.  Abdomen was irrigated out. Tubal ligation: Each fallopian tube could be easily identified to its fimbriated end, normal ovaries identified bilaterally, and fallopian tubes occluded at the midportion by elevating the tube placing a Filshie clip on the midportion of the tube and confirming proper placement and secure positioning  Abdomen was inspected and confirmed as hemostatic sponge and needle counts correct, anterior peritoneum closed with running 2-0 Vicryl the fascia closed with running 0 Vicryl subcutaneous tissues inspected for hemostasis.  The old cicatrix was removed by transecting just above the natural skin crease taking at the 2 cm wide ellipse of skin and underlying fatty tissue then reapproximating the fatty tissue in such a way as to better reapproximate the skin edges cleanly with good abdominal contours.  Subcutaneous tissues were reapproximated using 2-0 Vicryl interrupted sutures x5 and then subcuticular 4-0 Vicryl close the skin incision.  Steri-Strips were applied.  Sponge and needle counts were correct Condition recovery room excellent

## 2018-05-31 NOTE — Addendum Note (Signed)
Addendum  created 05/31/18 1723 by Algis GreenhouseBurger, Booker Bhatnagar A, CRNA   Sign clinical note

## 2018-05-31 NOTE — Transfer of Care (Signed)
Immediate Anesthesia Transfer of Care Note  Patient: Tracey Reese  Procedure(s) Performed: CESAREAN SECTION WITH BILATERAL TUBAL LIGATION (N/A Abdomen)  Patient Location: PACU  Anesthesia Type:Spinal  Level of Consciousness: awake  Airway & Oxygen Therapy: Patient Spontanous Breathing  Post-op Assessment: Report given to RN  Post vital signs: Reviewed  Last Vitals:  Vitals Value Taken Time  BP    Temp    Pulse    Resp    SpO2      Last Pain:  Vitals:   05/31/18 0637  TempSrc: Oral  PainSc: 0-No pain         Complications: No apparent anesthesia complications

## 2018-06-01 LAB — CBC
HCT: 29.2 % — ABNORMAL LOW (ref 36.0–46.0)
HEMOGLOBIN: 10 g/dL — AB (ref 12.0–15.0)
MCH: 30.9 pg (ref 26.0–34.0)
MCHC: 34.2 g/dL (ref 30.0–36.0)
MCV: 90.1 fL (ref 78.0–100.0)
PLATELETS: 152 10*3/uL (ref 150–400)
RBC: 3.24 MIL/uL — ABNORMAL LOW (ref 3.87–5.11)
RDW: 13.8 % (ref 11.5–15.5)
WBC: 11.2 10*3/uL — ABNORMAL HIGH (ref 4.0–10.5)

## 2018-06-01 MED ORDER — BUPROPION HCL ER (XL) 150 MG PO TB24
150.0000 mg | ORAL_TABLET | Freq: Every day | ORAL | Status: DC
  Administered 2018-06-01 – 2018-06-02 (×2): 150 mg via ORAL
  Filled 2018-06-01 (×2): qty 1

## 2018-06-01 NOTE — Progress Notes (Addendum)
Patient ID: Tracey Reese, female   DOB: 03/03/1986, 32 y.o.   MRN: [id number] POSTPARTUM PROGRESS NOTE  Post Partum Day 1 Subjective:  Tracey NatterJennifer R Reese is a 32 y.o. [id number] 2266w1d s/p rLTCS with a BTL.  No acute events overnight.  Pt denies problems with ambulating, voiding or po intake.  She denies nausea or vomiting. She endorse flatulence and has not had a BM yet. She endorses some abdominal soreness by her incision site. Pain is well controlled otherwise. Lochia Small.   Objective: Blood pressure 94/65, pulse 62, temperature 98 F (36.7 C), resp. rate 20, height 5\' 2"  (1.575 m), weight 91.9 kg (202 lb 8 oz), last menstrual period 08/30/2017, SpO2 97 %, unknown if currently breastfeeding.  Physical Exam:  General: alert, cooperative and no distress Lochia:normal flow Incision site: healing well, honeycomb dressing intact Chest: no respiratory distress Heart:regular rate, distal pulses intact Abdomen: soft, nontender,  Uterine Fundus: firm, appropriately tender DVT Evaluation: No calf swelling or tenderness Extremities: no edema  Recent Labs    05/31/18 1346 06/01/18 0520  HGB 12.1 10.0*  HCT 36.3 29.2*    Vitals:   05/31/18 1925 05/31/18 2134 06/01/18 0230 06/01/18 0607  BP:  124/70 120/88 94/65  Pulse:  68 71 62  Resp:  20 18 20   Temp:  97.9 F (36.6 C) 98.1 F (36.7 C) 98 F (36.7 C)  TempSrc:  Oral Oral   SpO2: 99% 97% 98% 97%  Weight:      Height:        Assessment/Plan:  ASSESSMENT: Tracey Reese is a 32 y.o. [id number] 7166w1d s/p rLTCS with BTL  Feeding: Breast Contraception: BTL Circumcision: Inpatient Patient will stay today   LOS: 1 day   Candie MileLindsey R Mitchell 06/01/2018, 9:08 AM   I confirm that I have verified the information documented in the physician assistant student's note and that I have also personally performed the physical exam and all medical decision making activities.  Clayton BiblesSamantha Weinhold, CNM 06/01/18  11:38 AM

## 2018-06-01 NOTE — Progress Notes (Deleted)
Subjective: Postpartum Day 1: Cesarean Delivery with BTL Patient reports tolerating PO, + flatus and no problems voiding.   Pain well managed with prescribed medications  Objective: Vital signs in last 24 hours: Temp:  [97.3 F (36.3 C)-98.1 F (36.7 C)] 97.8 F (36.6 C) (07/22 1050) Pulse Rate:  [62-98] 68 (07/22 1050) Resp:  [11-25] 20 (07/22 1050) BP: (94-126)/(37-88) 120/76 (07/22 1050) SpO2:  [95 %-100 %] 99 % (07/22 1050)  Physical Exam:  General: alert, cooperative, appears stated age and no distress Lochia: appropriate Uterine Fundus: firm Incision: no significant drainage, no dehiscence, no significant erythema DVT Evaluation: No evidence of DVT seen on physical exam.  Recent Labs    05/31/18 1346 06/01/18 0520  HGB 12.1 10.0*  HCT 36.3 29.2*    Assessment/Plan: Status post Cesarean section. Doing well postoperatively.  Continue current care.  Calvert CantorSamantha C Anaiz Qazi, CNM 06/01/2018, 10:59 AM

## 2018-06-02 MED ORDER — OXYCODONE-ACETAMINOPHEN 5-325 MG PO TABS: 2.0000 | ORAL_TABLET | Freq: Four times a day (QID) | ORAL | 0 refills | Status: DC | PRN

## 2018-06-02 MED ORDER — ENOXAPARIN SODIUM 40 MG/0.4ML ~~LOC~~ SOLN: 40.0000 mg | SUBCUTANEOUS | 3 refills | Status: DC

## 2018-06-02 MED ORDER — OXYCODONE-ACETAMINOPHEN 5-325 MG PO TABS: 1.0000 | ORAL_TABLET | Freq: Four times a day (QID) | ORAL | 0 refills | Status: AC | PRN

## 2018-06-02 MED ORDER — IBUPROFEN 600 MG PO TABS: 600.0000 mg | ORAL_TABLET | Freq: Four times a day (QID) | ORAL | 0 refills | Status: AC

## 2018-06-02 NOTE — Discharge Instructions (Signed)

## 2018-06-02 NOTE — Lactation Note (Signed)
This note was copied from a baby's chart. Lactation Consultation Note  Patient Name: Boy Chauncy LeanJennifer Goedken ZOXWR'UToday's Date: 06/02/2018 Reason for consult: Initial assessment;Term Breastfeeding consultation and support information given to patient.  Mom states newborn is latching well and nipples are comfortable.  Feeding on cue.  Baby cluster fed last night.  Reviewed basics and answered questions. Encouraged to call with assist/concerns.  Maternal Data Does the patient have breastfeeding experience prior to this delivery?: Yes  Feeding Feeding Type: Breast Fed Length of feed: 15 min  LATCH Score Latch: Grasps breast easily, tongue down, lips flanged, rhythmical sucking.  Audible Swallowing: Spontaneous and intermittent  Type of Nipple: Everted at rest and after stimulation  Comfort (Breast/Nipple): Soft / non-tender  Hold (Positioning): No assistance needed to correctly position infant at breast.  LATCH Score: 10  Interventions Interventions: Breast feeding basics reviewed  Lactation Tools Discussed/Used     Consult Status Consult Status: Complete    Huston FoleyMOULDEN, Demaurion Dicioccio S 06/02/2018, 8:50 AM

## 2018-06-02 NOTE — Discharge Summary (Addendum)
OB Discharge Summary     Patient Name: Tracey Reese DOB: Oct 11, 1986 MRN: [id number]  Date of admission: 05/31/2018 Delivering MD: Tracey Reese   Date of discharge: 06/02/2018  Admitting diagnosis: PREVIOUS C-SECTION X 2 Intrauterine pregnancy: [redacted]w[redacted]d     Secondary diagnosis:  Active Problems:   Encounter for sterilization   Previous cesarean section   Depression with anxiety   S/P cesarean section  Additional problems: Hx GDM in first pregnancy       Discharge diagnosis: Term Pregnancy Delivered                                                                                                Post partum procedures:BTL, Scar Revision procedure  Augmentation: None  Complications: None  Hospital course:  Sceduled C/S   32 y.o. yo [id number] at [redacted]w[redacted]d was admitted to the hospital 05/31/2018 for scheduled cesarean section with the following indication:Elective Repeat.  Membrane Rupture Time/Date: 10:15 AM ,05/31/2018   Patient delivered a Viable infant.05/31/2018  Details of operation can be found in separate operative note.  Pateint had an uncomplicated postpartum course.  She is ambulating, tolerating a regular diet, passing flatus, and urinating well. Patient is discharged home in stable condition on  06/02/18         Physical exam  Vitals:   06/01/18 1050 06/01/18 1423 06/01/18 2100 06/02/18 0511  BP: 120/76 105/60 124/84 130/83  Pulse: 68 71 73 75  Resp: 20 16 18 18   Temp: 97.8 F (36.6 C) 98 F (36.7 C) 97.9 F (36.6 C) 98 F (36.7 C)  TempSrc: Axillary Oral Axillary Axillary  SpO2: 99% 98% 100%   Weight:      Height:       General: alert Lochia: appropriate Uterine Fundus: firm Incision: Healing well with no significant drainage DVT Evaluation: No evidence of DVT seen on physical exam. No cords or calf tenderness. Labs: Lab Results  Component Value Date   WBC 11.2 (H) 06/01/2018   HGB 10.0 (L) 06/01/2018   HCT 29.2 (L) 06/01/2018   MCV 90.1 06/01/2018    PLT 152 06/01/2018   CMP Latest Ref Rng & Units 05/31/2018  Glucose 70 - 99 mg/dL -  BUN 6 - 20 mg/dL -  Creatinine 3.08 - 6.57 mg/dL 8.46  Sodium 962 - 952 mmol/L -  Potassium 3.5 - 5.1 mmol/L -  Chloride 98 - 111 mmol/L -  CO2 22 - 32 mmol/L -  Calcium 8.9 - 10.3 mg/dL -  Total Protein 6.5 - 8.1 g/dL -  Total Bilirubin 0.3 - 1.2 mg/dL -  Alkaline Phos 38 - 841 U/L -  AST 15 - 41 U/L -  ALT 0 - 44 U/L -    Discharge instruction: per After Visit Summary and "Baby and Me Booklet".  After visit meds:  Allergies as of 06/02/2018      Reactions   Celexa [citalopram] Other (See Comments)   Slurred speech; couldn't control body movements   Orange Oil Rash   Sulfa Antibiotics Rash      Medication List    TAKE  these medications   acetaminophen 325 MG tablet Commonly known as:  TYLENOL Take 650 mg by mouth every 6 (six) hours as needed for mild pain or headache.   ALPRAZolam 0.25 MG tablet Commonly known as:  XANAX Take 0.125 mg by mouth at bedtime as needed for anxiety.   aspirin EC 81 MG tablet Take 81 mg by mouth daily.   buPROPion 150 MG 24 hr tablet Commonly known as:  WELLBUTRIN XL Take 150 mg by mouth daily.   docusate sodium 100 MG capsule Commonly known as:  COLACE Take 100 mg by mouth daily as needed for mild constipation.   enoxaparin 40 MG/0.4ML injection Commonly known as:  LOVENOX Inject 0.4 mLs (40 mg total) into the skin daily. Start taking on:  06/03/2018   ibuprofen 600 MG tablet Commonly known as:  ADVIL,MOTRIN Take 1 tablet (600 mg total) by mouth every 6 (six) hours.   oxyCODONE-acetaminophen 5-325 MG tablet Commonly known as:  PERCOCET/ROXICET Take 1 tablet by mouth every 6 (six) hours as needed for up to 15 days for moderate pain.   PRENATAL VITAMINS PO Take 1 tablet by mouth daily.       Diet: home with mother  Activity: Advance as tolerated. Pelvic rest for 6 weeks.   Outpatient follow up:1 week Follow up Appt: Future  Appointments  Date Time Provider Department Center  06/10/2018 12:00 PM Tracey BurrowFerguson, Tracey V, MD [facility]  07/06/2018 11:30 AM Cheral MarkerBooker, Tracey R, CNM [facility]   Follow up Visit:No follow-ups on file.  Postpartum contraception: BTL   Newborn Data: Live born female  Birth Weight: 6 lb 12.1 oz (3065 g) APGAR: 9, 9  Newborn Delivery   Birth date/time:  05/31/2018 10:15:00 Delivery type:  C-Section, Low Transverse Trial of labor:  Yes C-section categorization:  Repeat     Baby Feeding: Breast Disposition:home with mother   06/02/2018 Tracey RavelingAnson B Sadey Yandell, MD

## 2018-06-03 ENCOUNTER — Encounter (INDEPENDENT_AMBULATORY_CARE_PROVIDER_SITE_OTHER): Payer: Self-pay

## 2018-06-03 ENCOUNTER — Other Ambulatory Visit: Payer: Self-pay | Admitting: Family Medicine

## 2018-06-03 ENCOUNTER — Telehealth: Payer: Self-pay | Admitting: Obstetrics & Gynecology

## 2018-06-03 DIAGNOSIS — G8918 Other acute postprocedural pain: Secondary | ICD-10-CM

## 2018-06-03 MED ORDER — OXYCODONE-ACETAMINOPHEN 5-325 MG PO TABS: 1.0000 | ORAL_TABLET | Freq: Four times a day (QID) | ORAL | 0 refills | Status: DC | PRN

## 2018-06-03 MED ORDER — OXYCODONE-ACETAMINOPHEN 5-325 MG PO TABS: 1.0000 | ORAL_TABLET | ORAL | 0 refills | Status: DC | PRN

## 2018-06-03 NOTE — Progress Notes (Signed)
Patient had trouble with percocet rx due to missing DEA from resident physician. I was able to electronically send prescription.

## 2018-06-03 NOTE — Telephone Encounter (Signed)
rx for percocet x 20 tabs sent to pharmacy

## 2018-06-07 ENCOUNTER — Encounter (HOSPITAL_COMMUNITY): Payer: Self-pay

## 2018-06-07 ENCOUNTER — Inpatient Hospital Stay (HOSPITAL_COMMUNITY)
Admission: AD | Admit: 2018-06-07 | Discharge: 2018-06-07 | Disposition: A | Discharge status: Home / Self Care | Payer: Commercial Managed Care - PPO | Source: Ambulatory Visit | Attending: Obstetrics & Gynecology | Admitting: Obstetrics & Gynecology

## 2018-06-07 ENCOUNTER — Other Ambulatory Visit: Payer: Self-pay

## 2018-06-07 DIAGNOSIS — F329 Major depressive disorder, single episode, unspecified: Secondary | ICD-10-CM | POA: Insufficient documentation

## 2018-06-07 DIAGNOSIS — I10 Essential (primary) hypertension: Secondary | ICD-10-CM | POA: Diagnosis not present

## 2018-06-07 DIAGNOSIS — Z79899 Other long term (current) drug therapy: Secondary | ICD-10-CM | POA: Diagnosis not present

## 2018-06-07 DIAGNOSIS — Z7982 Long term (current) use of aspirin: Secondary | ICD-10-CM | POA: Diagnosis not present

## 2018-06-07 DIAGNOSIS — R51 Headache: Secondary | ICD-10-CM | POA: Insufficient documentation

## 2018-06-07 DIAGNOSIS — Z87891 Personal history of nicotine dependence: Secondary | ICD-10-CM | POA: Insufficient documentation

## 2018-06-07 DIAGNOSIS — F419 Anxiety disorder, unspecified: Secondary | ICD-10-CM | POA: Insufficient documentation

## 2018-06-07 DIAGNOSIS — R519 Headache, unspecified: Secondary | ICD-10-CM

## 2018-06-07 LAB — PROTEIN / CREATININE RATIO, URINE: CREATININE, URINE: 14 mg/dL

## 2018-06-07 LAB — URINALYSIS, ROUTINE W REFLEX MICROSCOPIC
Bilirubin Urine: NEGATIVE
GLUCOSE, UA: NEGATIVE mg/dL
Ketones, ur: NEGATIVE mg/dL
Leukocytes, UA: NEGATIVE
Nitrite: NEGATIVE
PH: 6 (ref 5.0–8.0)
PROTEIN: NEGATIVE mg/dL
Specific Gravity, Urine: 1.003 — ABNORMAL LOW (ref 1.005–1.030)

## 2018-06-07 LAB — CBC WITH DIFFERENTIAL/PLATELET
Basophils Absolute: 0 10*3/uL (ref 0.0–0.1)
Basophils Relative: 0 %
EOS ABS: 0.2 10*3/uL (ref 0.0–0.7)
EOS PCT: 2 %
HCT: 37.2 % (ref 36.0–46.0)
Hemoglobin: 12.1 g/dL (ref 12.0–15.0)
LYMPHS ABS: 2.6 10*3/uL (ref 0.7–4.0)
LYMPHS PCT: 28 %
MCH: 29.4 pg (ref 26.0–34.0)
MCHC: 32.5 g/dL (ref 30.0–36.0)
MCV: 90.3 fL (ref 78.0–100.0)
MONO ABS: 0.3 10*3/uL (ref 0.1–1.0)
MONOS PCT: 4 %
Neutro Abs: 6.2 10*3/uL (ref 1.7–7.7)
Neutrophils Relative %: 66 %
PLATELETS: 289 10*3/uL (ref 150–400)
RBC: 4.12 MIL/uL (ref 3.87–5.11)
RDW: 13.9 % (ref 11.5–15.5)
WBC: 9.3 10*3/uL (ref 4.0–10.5)

## 2018-06-07 LAB — COMPREHENSIVE METABOLIC PANEL
ALT: 21 U/L (ref 0–44)
ANION GAP: 11 (ref 5–15)
AST: 16 U/L (ref 15–41)
Albumin: 3.2 g/dL — ABNORMAL LOW (ref 3.5–5.0)
Alkaline Phosphatase: 119 U/L (ref 38–126)
BUN: 13 mg/dL (ref 6–20)
CHLORIDE: 105 mmol/L (ref 98–111)
CO2: 21 mmol/L — ABNORMAL LOW (ref 22–32)
CREATININE: 0.57 mg/dL (ref 0.44–1.00)
Calcium: 9 mg/dL (ref 8.9–10.3)
Glucose, Bld: 85 mg/dL (ref 70–99)
Potassium: 4 mmol/L (ref 3.5–5.1)
SODIUM: 137 mmol/L (ref 135–145)
Total Bilirubin: 0.4 mg/dL (ref 0.3–1.2)
Total Protein: 6.6 g/dL (ref 6.5–8.1)

## 2018-06-07 MED ORDER — NIFEDIPINE ER OSMOTIC RELEASE 30 MG PO TB24: 30.0000 mg | ORAL_TABLET | Freq: Every day | ORAL | 0 refills | Status: DC

## 2018-06-07 MED ORDER — BUTALBITAL-APAP-CAFFEINE 50-325-40 MG PO TABS
1.0000 | ORAL_TABLET | Freq: Once | ORAL | Status: AC
  Administered 2018-06-07: 1 via ORAL
  Filled 2018-06-07: qty 1

## 2018-06-07 NOTE — MAU Note (Signed)
Pt delivered on Sunday via c/s. Ibuprofen 800 mg at 8 am and 3:30. No relief. 140-150/110's. Pt states its the worst headache she ever had. Pt also has blurred vision and nausea.

## 2018-06-07 NOTE — MAU Provider Note (Signed)
History     CSN: [id number]  Arrival date and time: 06/07/18 1859   First Provider Initiated Contact with Patient 06/07/18 1955      Chief Complaint  Patient presents with  . Hypertension  . Headache   HPI [name] is a 32 y.o. [id number] who is POD #7 after RCS presents to MAU today with complaint of elevated blood pressure. She states headache with increasing severity today. She tried Ibuprofen twice today without relief. She has had blurred vision, floaters and photophobia. She denies RUQ abdominal pain or peripheral edema. She had PP pre-eclampsia with a previous pregnancy, but no issues with HTN during this recent pregnancy.    OB History    Gravida  7   Para  3   Term  3   Preterm      AB  4   Living  3     SAB  4   TAB      Ectopic      Multiple  0   Live Births  3           Past Medical History:  Diagnosis Date  . Anxiety   . Bleeding 11/17/2015  . Complication of anesthesia    difficulty placing epidural  . Constipation   . Depression   . Gestational diabetes    with first pregnancy  . History of pre-eclampsia in prior pregnancy, currently pregnant   . Hypertension    during second pregnancy  . Vaginal Pap smear, abnormal     Past Surgical History:  Procedure Laterality Date  . CESAREAN SECTION  2012,2015  . CESAREAN SECTION WITH BILATERAL TUBAL LIGATION N/A 05/31/2018   Procedure: CESAREAN SECTION WITH BILATERAL TUBAL LIGATION;  Surgeon: Tilda Burrow, MD;  Location: [facility] BIRTHING SUITES;  Service: Obstetrics;  Laterality: N/A;  . COLPOSCOPY  2006  . LEEP  2006  . TONSILLECTOMY AND ADENOIDECTOMY  1989  . WISDOM TOOTH EXTRACTION  2009    Family History  Problem Relation Age of Onset  . Cancer Mother        cervical  . Diabetes Mother        prediabetes  . Syncope episode Sister   . Hyperlipidemia Maternal Grandfather   . Other Maternal Grandfather        heart stents  . Parkinson's disease Paternal Grandmother   .  Cancer Paternal Grandmother        colon    Social History   Tobacco Use  . Smoking status: Former Smoker    Types: Cigarettes  . Smokeless tobacco: Former Neurosurgeon    Types: Chew  . Tobacco comment: quit in 2011  Substance Use Topics  . Alcohol use: No    Frequency: Never    Comment: occ  . Drug use: No    Allergies:  Allergies  Allergen Reactions  . Celexa [Citalopram] Other (See Comments)    Slurred speech; couldn't control body movements  . Orange Oil Rash  . Sulfa Antibiotics Rash    Medications Prior to Admission  Medication Sig Dispense Refill Last Dose  . ALPRAZolam (XANAX) 0.25 MG tablet Take 0.125 mg by mouth at bedtime as needed for anxiety.   06/07/2018 at Unknown time  . aspirin EC 81 MG tablet Take 81 mg by mouth daily.   Past Week at Unknown time  . buPROPion (WELLBUTRIN XL) 150 MG 24 hr tablet Take 150 mg by mouth daily.   06/07/2018 at Unknown time  .  docusate sodium (COLACE) 100 MG capsule Take 100 mg by mouth daily as needed for mild constipation.   06/07/2018 at Unknown time  . ibuprofen (ADVIL,MOTRIN) 600 MG tablet Take 1 tablet (600 mg total) by mouth every 6 (six) hours. 30 tablet 0 06/07/2018 at 1500  . oxyCODONE-acetaminophen (PERCOCET/ROXICET) 5-325 MG tablet Take 1-2 tablets by mouth every 6 (six) hours as needed. 30 tablet 0 06/06/2018 at Unknown time  . Prenatal Multivit-Min-Fe-FA (PRENATAL VITAMINS PO) Take 1 tablet by mouth daily.    Past Week at Unknown time  . acetaminophen (TYLENOL) 325 MG tablet Take 650 mg by mouth every 6 (six) hours as needed for mild pain or headache.   05/28/2018 at Unknown time  . enoxaparin (LOVENOX) 40 MG/0.4ML injection Inject 0.4 mLs (40 mg total) into the skin daily. 30 Syringe 3   . oxyCODONE-acetaminophen (PERCOCET/ROXICET) 5-325 MG tablet Take 1 tablet by mouth every 6 (six) hours as needed for up to 15 days for moderate pain. 30 tablet 0   . oxyCODONE-acetaminophen (PERCOCET/ROXICET) 5-325 MG tablet Take 1 tablet by  mouth every 6 (six) hours as needed for severe pain. 20 tablet 0     Review of Systems  Constitutional: Negative for fever.  Eyes: Positive for photophobia and visual disturbance.  Cardiovascular: Negative for leg swelling.  Gastrointestinal: Negative for abdominal pain, constipation, diarrhea, nausea and vomiting.  Neurological: Positive for headaches.   Physical Exam   Blood pressure 121/81, pulse 74, temperature 98.5 F (36.9 C), temperature source Oral, resp. rate 17, weight 194 lb (88 kg), unknown if currently breastfeeding.  Physical Exam  Nursing note and vitals reviewed. Constitutional: She is oriented to person, place, and time. She appears well-developed and well-nourished. No distress.  HENT:  Head: Normocephalic and atraumatic.  Cardiovascular: Normal rate.  Respiratory: Effort normal.  GI: Soft. She exhibits no distension and no mass. There is no tenderness. There is no rebound and no guarding.  Musculoskeletal: She exhibits no edema.  Neurological: She is alert and oriented to person, place, and time. She has normal reflexes.  No clonus  Skin: Skin is warm and dry. No erythema.  Psychiatric: She has a normal mood and affect.     Results for orders placed or performed during the hospital encounter of 06/07/18 (from the past 24 hour(s))  Urinalysis, Routine w reflex microscopic     Status: Abnormal   Collection Time: 06/07/18  7:35 PM  Result Value Ref Range   Color, Urine COLORLESS (A) YELLOW   APPearance CLEAR CLEAR   Specific Gravity, Urine 1.003 (L) 1.005 - 1.030   pH 6.0 5.0 - 8.0   Glucose, UA NEGATIVE NEGATIVE mg/dL   Hgb urine dipstick SMALL (A) NEGATIVE   Bilirubin Urine NEGATIVE NEGATIVE   Ketones, ur NEGATIVE NEGATIVE mg/dL   Protein, ur NEGATIVE NEGATIVE mg/dL   Nitrite NEGATIVE NEGATIVE   Leukocytes, UA NEGATIVE NEGATIVE   WBC, UA 0-5 0 - 5 WBC/hpf   Bacteria, UA RARE (A) NONE SEEN   Squamous Epithelial / LPF 0-5 0 - 5  Protein / creatinine  ratio, urine     Status: None   Collection Time: 06/07/18  7:35 PM  Result Value Ref Range   Creatinine, Urine 14.00 mg/dL   Total Protein, Urine <6.00 mg/dL   Protein Creatinine Ratio        0.00 - 0.15 mg/mg[Cre]  CBC with Differential/Platelet     Status: None   Collection Time: 06/07/18  7:44 PM  Result Value  Ref Range   WBC 9.3 4.0 - 10.5 K/uL   RBC 4.12 3.87 - 5.11 MIL/uL   Hemoglobin 12.1 12.0 - 15.0 g/dL   HCT 40.9 81.1 - 91.4 %   MCV 90.3 78.0 - 100.0 fL   MCH 29.4 26.0 - 34.0 pg   MCHC 32.5 30.0 - 36.0 g/dL   RDW 78.2 95.6 - 21.3 %   Platelets 289 150 - 400 K/uL   Neutrophils Relative % 66 %   Neutro Abs 6.2 1.7 - 7.7 K/uL   Lymphocytes Relative 28 %   Lymphs Abs 2.6 0.7 - 4.0 K/uL   Monocytes Relative 4 %   Monocytes Absolute 0.3 0.1 - 1.0 K/uL   Eosinophils Relative 2 %   Eosinophils Absolute 0.2 0.0 - 0.7 K/uL   Basophils Relative 0 %   Basophils Absolute 0.0 0.0 - 0.1 K/uL  Comprehensive metabolic panel     Status: Abnormal   Collection Time: 06/07/18  7:44 PM  Result Value Ref Range   Sodium 137 135 - 145 mmol/L   Potassium 4.0 3.5 - 5.1 mmol/L   Chloride 105 98 - 111 mmol/L   CO2 21 (L) 22 - 32 mmol/L   Glucose, Bld 85 70 - 99 mg/dL   BUN 13 6 - 20 mg/dL   Creatinine, Ser 0.86 0.44 - 1.00 mg/dL   Calcium 9.0 8.9 - 57.8 mg/dL   Total Protein 6.6 6.5 - 8.1 g/dL   Albumin 3.2 (L) 3.5 - 5.0 g/dL   AST 16 15 - 41 U/L   ALT 21 0 - 44 U/L   Alkaline Phosphatase 119 38 - 126 U/L   Total Bilirubin 0.4 0.3 - 1.2 mg/dL   GFR calc non Af Amer >60 >60 mL/min   GFR calc Af Amer >60 >60 mL/min   Anion gap 11 5 - 15    MAU Course  Procedures None  MDM UA, CBC, CMP and Urine protein/creatinine ratio today  Serial BPs Discussed with Dr. Debroah Loop. Discharge home with Procardia and follow-up in 2-3 days for BP check at [facility].  Assessment and Plan  A: POD #7 after RCS Headache  Elevated blood pressure, resolved   P:  Discharge home Rx for Procardia sent to  patient's pharmacy  Pre-eclampsia precautions discussed Patient advised to follow-up with [facility] on Tuesday or Wednesday for blood pressure check  Patient may return to MAU as needed or if her condition were to change or worsen  Vonzella Nipple, PA-C 06/07/2018, 9:24 PM

## 2018-06-07 NOTE — Discharge Instructions (Signed)
Preeclampsia and Eclampsia °Preeclampsia is a serious condition that develops only during pregnancy. It is also called toxemia of pregnancy. This condition causes high blood pressure along with other symptoms, such as swelling and headaches. These symptoms may develop as the condition gets worse. Preeclampsia may occur at 20 weeks of pregnancy or later. °Diagnosing and treating preeclampsia early is very important. If not treated early, it can cause serious problems for you and your baby. One problem it can lead to is eclampsia, which is a condition that causes muscle jerking or shaking (convulsions or seizures) in the mother. Delivering your baby is the best treatment for preeclampsia or eclampsia. Preeclampsia and eclampsia symptoms usually go away after your baby is born. °What are the causes? °The cause of preeclampsia is not known. °What increases the risk? °The following risk factors make you more likely to develop preeclampsia: °· Being pregnant for the first time. °· Having had preeclampsia during a past pregnancy. °· Having a family history of preeclampsia. °· Having high blood pressure. °· Being pregnant with twins or triplets. °· Being 35 or older. °· Being African-American. °· Having kidney disease or diabetes. °· Having medical conditions such as lupus or blood diseases. °· Being very overweight (obese). ° °What are the signs or symptoms? °The earliest signs of preeclampsia are: °· High blood pressure. °· Increased protein in your urine. Your health care provider will check for this at every visit before you give birth (prenatal visit). ° °Other symptoms that may develop as the condition gets worse include: °· Severe headaches. °· Sudden weight gain. °· Swelling of the hands, face, legs, and feet. °· Nausea and vomiting. °· Vision problems, such as blurred or double vision. °· Numbness in the face, arms, legs, and feet. °· Urinating less than usual. °· Dizziness. °· Slurred speech. °· Abdominal pain,  especially upper abdominal pain. °· Convulsions or seizures. ° °Symptoms generally go away after giving birth. °How is this diagnosed? °There are no screening tests for preeclampsia. Your health care provider will ask you about symptoms and check for signs of preeclampsia during your prenatal visits. You may also have tests that include: °· Urine tests. °· Blood tests. °· Checking your blood pressure. °· Monitoring your baby’s heart rate. °· Ultrasound. ° °How is this treated? °You and your health care provider will determine the treatment approach that is best for you. Treatment may include: °· Having more frequent prenatal exams to check for signs of preeclampsia, if you have an increased risk for preeclampsia. °· Bed rest. °· Reducing how much salt (sodium) you eat. °· Medicine to lower your blood pressure. °· Staying in the hospital, if your condition is severe. There, treatment will focus on controlling your blood pressure and the amount of fluids in your body (fluid retention). °· You may need to take medicine (magnesium sulfate) to prevent seizures. This medicine may be given as an injection or through an IV tube. °· Delivering your baby early, if your condition gets worse. You may have your labor started with medicine (induced), or you may have a cesarean delivery. ° °Follow these instructions at home: °Eating and drinking ° °· Drink enough fluid to keep your urine clear or pale yellow. °· Eat a healthy diet that is low in sodium. Do not add salt to your food. Check nutrition labels to see how much sodium a food or beverage contains. °· Avoid caffeine. °Lifestyle °· Do not use any products that contain nicotine or tobacco, such as cigarettes   and e-cigarettes. If you need help quitting, ask your health care provider. °· Do not use alcohol or drugs. °· Avoid stress as much as possible. Rest and get plenty of sleep. °General instructions °· Take over-the-counter and prescription medicines only as told by your  health care provider. °· When lying down, lie on your side. This keeps pressure off of your baby. °· When sitting or lying down, raise (elevate) your feet. Try putting some pillows underneath your lower legs. °· Exercise regularly. Ask your health care provider what kinds of exercise are best for you. °· Keep all follow-up and prenatal visits as told by your health care provider. This is important. °How is this prevented? °To prevent preeclampsia or eclampsia from developing during another pregnancy: °· Get proper medical care during pregnancy. Your health care provider may be able to prevent preeclampsia or diagnose and treat it early. °· Your health care provider may have you take a low-dose aspirin or a calcium supplement during your next pregnancy. °· You may have tests of your blood pressure and kidney function after giving birth. °· Maintain a healthy weight. Ask your health care provider for help managing weight gain during pregnancy. °· Work with your health care provider to manage any long-term (chronic) health conditions you have, such as diabetes or kidney problems. ° °Contact a health care provider if: °· You gain more weight than expected. °· You have headaches. °· You have nausea or vomiting. °· You have abdominal pain. °· You feel dizzy or light-headed. °Get help right away if: °· You develop sudden or severe swelling anywhere in your body. This usually happens in the legs. °· You gain 5 lbs (2.3 kg) or more during one week. °· You have severe: °? Abdominal pain. °? Headaches. °? Dizziness. °? Vision problems. °? Confusion. °? Nausea or vomiting. °· You have a seizure. °· You have trouble moving any part of your body. °· You develop numbness in any part of your body. °· You have trouble speaking. °· You have any abnormal bleeding. °· You pass out. °This information is not intended to replace advice given to you by your health care provider. Make sure you discuss any questions you have with your health  care provider. °Document Released: 10/25/2000 Document Revised: 06/25/2016 Document Reviewed: 06/03/2016 °Elsevier Interactive Patient Education © 2018 [employer] ° °

## 2018-06-08 ENCOUNTER — Encounter: Payer: Commercial Managed Care - PPO | Admitting: Obstetrics & Gynecology

## 2018-06-10 ENCOUNTER — Encounter: Payer: Self-pay | Admitting: Obstetrics and Gynecology

## 2018-06-10 ENCOUNTER — Ambulatory Visit (INDEPENDENT_AMBULATORY_CARE_PROVIDER_SITE_OTHER): Payer: Commercial Managed Care - PPO | Admitting: Obstetrics and Gynecology

## 2018-06-10 VITALS — BP 142/86 | HR 102 | Ht 62.0 in | Wt 185.0 lb

## 2018-06-10 DIAGNOSIS — Z9889 Other specified postprocedural states: Secondary | ICD-10-CM

## 2018-06-10 DIAGNOSIS — Z09 Encounter for follow-up examination after completed treatment for conditions other than malignant neoplasm: Secondary | ICD-10-CM

## 2018-06-10 MED ORDER — BUTALBITAL-APAP-CAFFEINE 50-325-40 MG PO TABS: 1.0000 | ORAL_TABLET | Freq: Four times a day (QID) | ORAL | 1 refills | Status: DC | PRN

## 2018-06-10 NOTE — Progress Notes (Signed)
Patient ID: Rennie NatterJennifer R Sadowski, female   DOB: 10/18/1986, 32 y.o.   MRN: [id number]  Subjective:  Rennie NatterJennifer R Mataya is a 32 y.o. female now 1 week and 3 days status post C-section with BLT tubal ligation  She has complaints of headaches after delivering. Her headache today is not the worst today but is still bad. Sh ehas only been taking ibuprofen with minimal relief. [facility] after her pressure was very high and was placed on procardia. After her first child she was prescribed procardia and her reading have been alright but not great.  Review of Systems Negative except   Diet:   normal   Bowel movements : normal.  The patient is not having any pain.  Objective:  There were no vitals taken for this visit. General:Well developed, well nourished.  No acute distress. Abdomen: Bowel sounds normal, soft, non-tender. Pelvic Exam:  DEFERRED  Incision(s): Healing well with light swelling, no drainage, no hernia, no swelling, no dehiscence  Assessment:  Post-Op 1 weeks and 3 days s/p C-section with BLT tubal ligation   Doing well postoperatively.   Plan:  2. Current medications. Keep Procardia to once a day but if pressure stays risen consistently switch to twice a day for for 5 day. Rx fiorocet 3. Activity restrictions: none 4. return to work: not applicable. 5. Follow up in 4 weeks.  By signing my name below, I, Arnette NorrisMari Johnson, attest that this documentation has been prepared under the direction and in the presence of Tilda BurrowFerguson, Julianna Vanwagner V, MD Electronically Signed: Arnette NorrisMari Johnson Medical Scribe. 06/10/18. 12:02 PM.  I personally performed the services described in this documentation, which was SCRIBED in my presence. The recorded information has been reviewed and considered accurate. It has been edited as necessary during review. Tilda BurrowJohn V Areta Terwilliger, MD

## 2018-07-06 ENCOUNTER — Ambulatory Visit (INDEPENDENT_AMBULATORY_CARE_PROVIDER_SITE_OTHER): Payer: Commercial Managed Care - PPO | Admitting: Women's Health

## 2018-07-06 ENCOUNTER — Encounter: Payer: Self-pay | Admitting: Women's Health

## 2018-07-06 DIAGNOSIS — Z8632 Personal history of gestational diabetes: Secondary | ICD-10-CM | POA: Diagnosis not present

## 2018-07-06 DIAGNOSIS — Z98891 History of uterine scar from previous surgery: Secondary | ICD-10-CM | POA: Diagnosis not present

## 2018-07-06 DIAGNOSIS — O09299 Supervision of pregnancy with other poor reproductive or obstetric history, unspecified trimester: Secondary | ICD-10-CM

## 2018-07-06 NOTE — Progress Notes (Addendum)
POSTPARTUM VISIT Patient name: Tracey Reese MRN [id number]  Date of birth: [birth year] Chief Complaint:   Postpartum Care  History of Present Illness:   Tracey Reese is a 32 y.o. [phone] Caucasian female being seen today for a postpartum visit. She is 5 weeks postpartum following a repeat cesarean section, low transverse incision w/ BTL at 39.1 gestational weeks. Anesthesia: spinal. I have fully reviewed the prenatal and intrapartum course. Pregnancy complicated by h/o pre-e. Postpartum course has been complicated by PPHTN on procardia, took this am. Bleeding no bleeding. Bowel function is some constipation, eats ice cream or takes a senna prn. Bladder function is normal. Contraception method is tubal ligation.  [place] Postpartum Depression Screening: positive. Score 13. Has h/o dep/anx, on wellbutrin, prn xanax- decreased appetite, sleeping more, still finds joy in things she used to. Denies SI/HI/II. Declines counseling or need for adjustment of meds at this time.  Wants to stay out of work for 12wks.  Last pap 12/04/17.  Results were neg w/ -HRHPV .  No LMP recorded. (Menstrual status: Lactating).  Baby's course has been uncomplicated. Baby is feeding by breast.  Review of Systems:   Pertinent items are noted in HPI Denies Abnormal vaginal discharge w/ itching/odor/irritation, headaches, visual changes, shortness of breath, chest pain, abdominal pain, severe nausea/vomiting, or problems with urination or bowel movements. Pertinent History Reviewed:  Reviewed past medical,surgical, obstetrical and family history.  Reviewed problem list, medications and allergies. OB History  Gravida Para Term Preterm AB Living  7 3 3   4 3   SAB TAB Ectopic Multiple Live Births  4     0 3    # Outcome Date GA Lbr Len/2nd Weight Sex Delivery Anes PTL Lv  7 Term 05/31/18 [redacted]w[redacted]d  6 lb 12.1 oz (3.065 kg) M CS-LTranv Spinal  LIV  6 SAB 2017          5 SAB 2016          4 SAB 2016          3  Term 07/29/14 [redacted]w[redacted]d  6 lb 1 oz (2.75 kg) F CS-LTranv EPI N LIV     Complications: Preeclampsia  2 SAB 08/2013          1 Term 03/03/11 [redacted]w[redacted]d  8 lb 6 oz (3.799 kg) M CS-LTranv EPI N LIV     Complications: Failure to Progress in Second Stage, Gestational diabetes   Physical Assessment:   Vitals:   07/06/18 1129  BP: 116/80  Pulse: 83  Weight: 185 lb 8 oz (84.1 kg)  Height: 5\' 2"  (1.575 m)  Body mass index is 33.93 kg/m.       Physical Examination:   General appearance: alert, well appearing, and in no distress  Mental status: alert, oriented to person, place, and time  Skin: warm & dry   Cardiovascular: normal heart rate noted   Respiratory: normal respiratory effort, no distress   Breasts: deferred, no complaints   Abdomen: soft, non-tender, c/s incision well-healed   Pelvic: examination not indicated  Rectal: deferred  Extremities: no edema       No results found for this or any previous visit (from the past 24 hour(s)).  Assessment & Plan:  1) Postpartum exam 2) 5 wks s/p RLTCS w/ BTL 3) Breastfeeding 4) Depression screening 5) PPHTN> stop procardia, return in 2d for bp check w/ nurse,  6) Dep/anx> on wellbutrin and prn xanax, declines counseling or need for med adjustment at this time,  wants to stay out of work for 12 weeks  Meds: No orders of the defined types were placed in this encounter.   Follow-up: Return for 2d for bp check w/ nurse, then Jan for physical.   No orders of the defined types were placed in this encounter.   Cheral MarkerKimberly R Booker CNM, [facility]NP-BC 07/06/2018 12:26 PM

## 2018-07-06 NOTE — Patient Instructions (Signed)
Tips To Increase Milk Supply  Lots of water! Enough so that your urine is clear  Plenty of calories, if you're not getting enough calories, your milk supply can decrease  Breastfeed/pump often, every 2-3 hours x 20-30mins  Fenugreek 3 pills 3 times a day, this may make your urine smell like maple syrup  Mother's Milk Tea  Lactation cookies, google for the recipe  Real oatmeal   

## 2018-07-08 ENCOUNTER — Ambulatory Visit: Payer: Commercial Managed Care - PPO

## 2018-07-08 VITALS — BP 130/84 | HR 74 | Ht 62.0 in | Wt 183.0 lb

## 2018-07-08 DIAGNOSIS — Z0130 Encounter for examination of blood pressure without abnormal findings: Secondary | ICD-10-CM

## 2018-07-08 NOTE — Progress Notes (Signed)
Pt here for  B/P check 130/84 Pulse 74. Stop taking Procardia XL two day ago. Spoke with Drenda FreezeFran Dishmon about B/P. Advise Pt can stop taking Medication.Pt understood. Pad CMA

## 2018-07-16 ENCOUNTER — Telehealth: Payer: Self-pay | Admitting: *Deleted

## 2018-07-17 ENCOUNTER — Encounter: Payer: Self-pay | Admitting: *Deleted

## 2018-07-17 ENCOUNTER — Telehealth: Payer: Self-pay | Admitting: *Deleted

## 2018-07-17 NOTE — Telephone Encounter (Signed)
mychart message sent

## 2018-07-30 ENCOUNTER — Telehealth: Payer: Self-pay | Admitting: Advanced Practice Midwife

## 2018-07-30 NOTE — Telephone Encounter (Signed)
Pt called stating that she has been having pelvic pain that is becoming progressively worse, mainly with ambulation. She states that it feels as though there are "hard knots internally at both ends of her c/s incision". She states that externally the incision looks healed and is not experiencing any drainage, redness, or fever. Pt requests to have this issue evaluated by a provider as she just went back to work and the pain is interfering. Connected to scheduling for appt.

## 2018-07-30 NOTE — Telephone Encounter (Signed)
Please call pt in ref to a knot near c section site and pain

## 2018-08-03 ENCOUNTER — Ambulatory Visit (INDEPENDENT_AMBULATORY_CARE_PROVIDER_SITE_OTHER): Payer: Self-pay | Admitting: Obstetrics and Gynecology

## 2018-08-03 ENCOUNTER — Encounter: Payer: Self-pay | Admitting: Obstetrics and Gynecology

## 2018-08-03 VITALS — BP 128/78 | HR 82 | Wt 182.8 lb

## 2018-08-03 DIAGNOSIS — Z9889 Other specified postprocedural states: Secondary | ICD-10-CM

## 2018-08-03 DIAGNOSIS — T8189XA Other complications of procedures, not elsewhere classified, initial encounter: Secondary | ICD-10-CM

## 2018-08-03 NOTE — Progress Notes (Signed)
Patient ID: Tracey Reese, female   DOB: 12/12/1985, 32 y.o.   MRN: [id number]  Subjective:  Tracey NatterJennifer R Reif is a 32 y.o. female now 9 weeks status post C-section.   Hard lump on left lower incison, noticed it initially on the right and gave her a sharp pain then knots started to form at incision site Review of Systems Negative except hard knots at incision site   Diet:   normal   Bowel movements : normal.  pain worsens as the day progresses,   Objective:  There were no vitals taken for this visit. General:Well developed, well nourished.  No acute distress. Abdomen: Bowel sounds normal, soft, non-tender.  There is some firmness at the fascial level at the end of the Pfannenstiel incision that is become more easily palpable now that superficial subcutaneous swelling is completely resolved.  There is no evidence of hernia upon patient Valsalva Pelvic Exam: Not examined  Incision(s):  Abdominal fibrosis, Healing well, no drainage, no erythema, no hernia, no swelling, no dehiscence slight fibrosis at level of fascia  Assessment:  1. Abdominal wall fibrosis 2. Post-Op 9 weeks s/p C-Section, Doing well postoperatively.  Residual fibrosis at fascia, patient told to expect gradual slight improvement over the next 6 months and minimal improvement thereafter patient finds this explanation acceptable   Plan:  1. Activity restrictions: none 2. return to work: not applicable, returned to work last week. 3. Follow up in PRN, then 1 year for PAP& PE  By signing my name below, I, Arnette NorrisMari Johnson, attest that this documentation has been prepared under the direction and in the presence of Tilda BurrowFerguson, Marcin Holte V, MD. Electronically Signed: Arnette NorrisMari Johnson Medical Scribe. 08/03/18. 1:20 PM.  I personally performed the services described in this documentation, which was SCRIBED in my presence. The recorded information has been reviewed and considered accurate. It has been edited as necessary during  review. Tilda BurrowJohn V Sonny Anthes, MD

## 2018-08-04 DIAGNOSIS — T8189XA Other complications of procedures, not elsewhere classified, initial encounter: Secondary | ICD-10-CM | POA: Insufficient documentation

## 2018-11-22 IMAGING — CT CT ABD-PELV W/ CM
2 of 3 series · 16 of 46 positions shown, 18 images · IV contrast (Isovue)
Comparison: None.

CLINICAL DATA: Right lower quadrant pain with vomiting

EXAM:
CT ABDOMEN AND PELVIS WITH CONTRAST
TECHNIQUE: Multidetector CT imaging of the abdomen and pelvis was performed
using the standard protocol following bolus administration of
intravenous contrast.
CONTRAST:  100mL QZRRPZ-U88 IOPAMIDOL (QZRRPZ-U88) INJECTION 61%,
30mL [id number] IOPAMIDOL ([id number]-U88) INJECTION 61%

[Series 2: axial st · axial · 0.70mm/px · z∈[+877,+1247]mm · 13 of 86 slices shown, 15 images]
[im 6/86  soft-tissue]
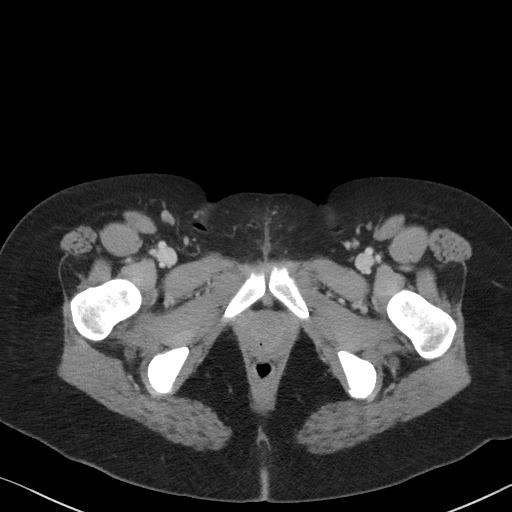
[im 6/86  bone]
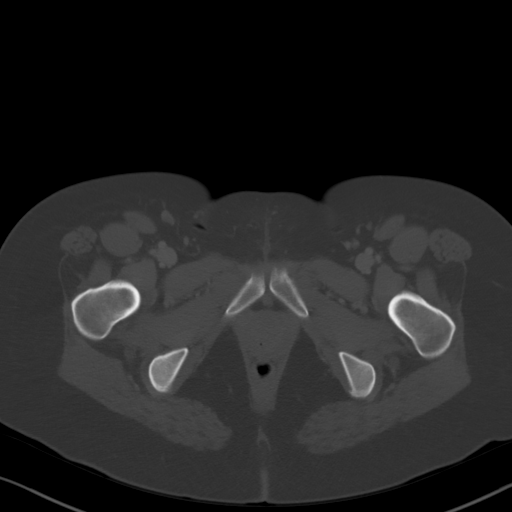
[im 11/86  soft-tissue]
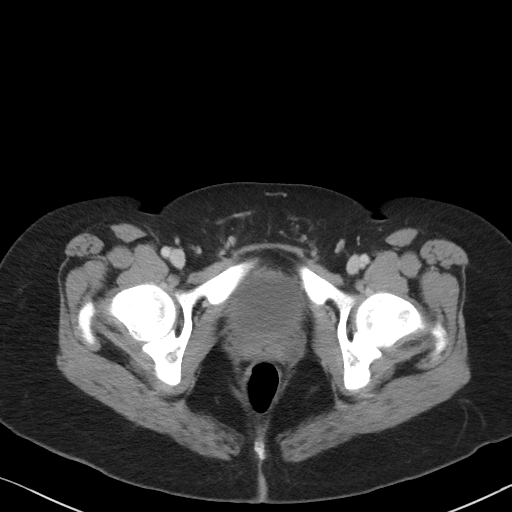
[im 17/86  soft-tissue]
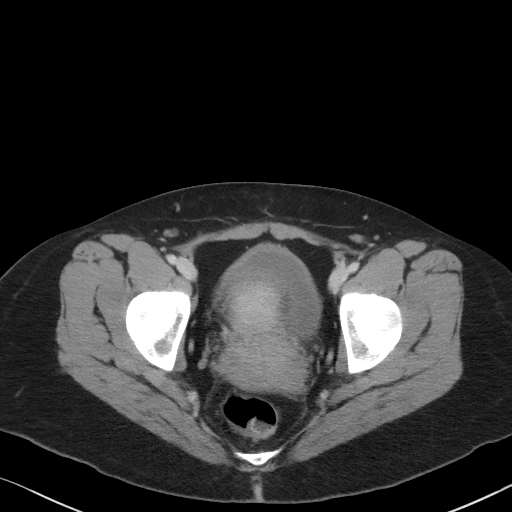
[im 25/86  soft-tissue]
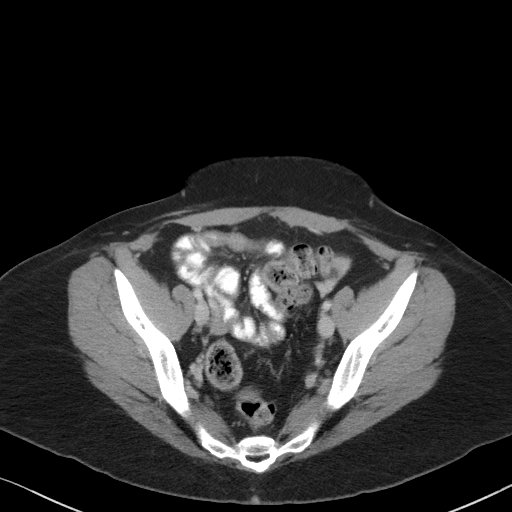
[im 31/86  soft-tissue]
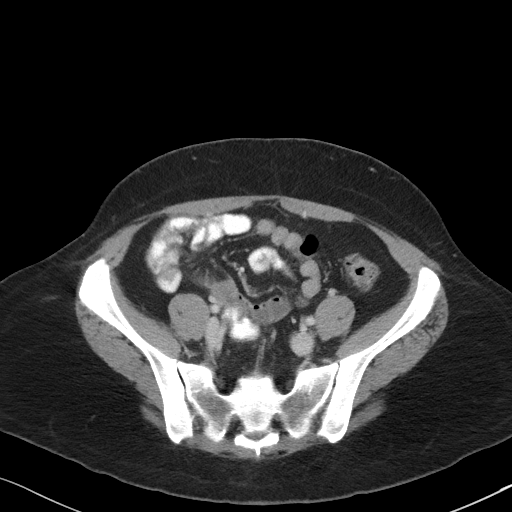
[im 36/86  soft-tissue]
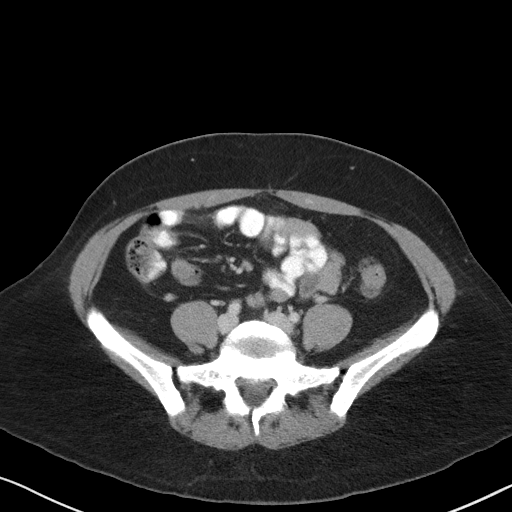
[im 44/86  soft-tissue]
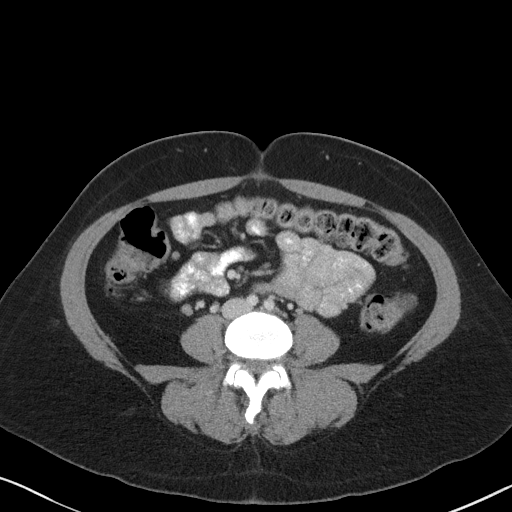
[im 50/86  soft-tissue]
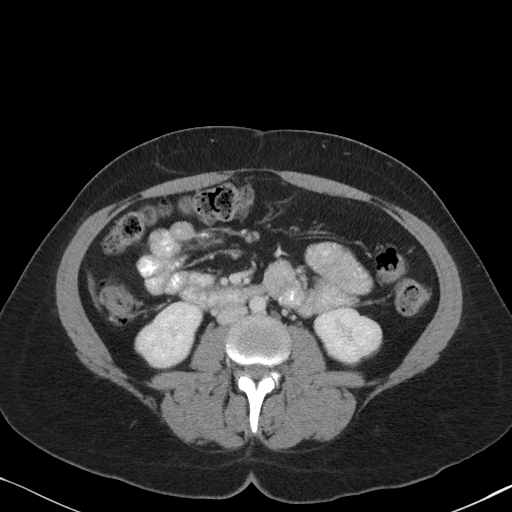
[im 55/86  soft-tissue]
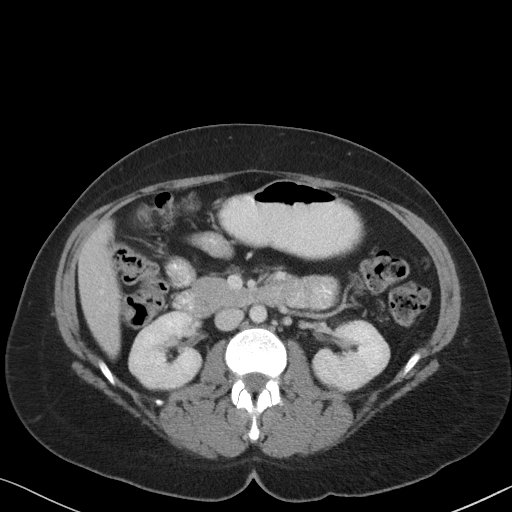
[im 55/86  bone]
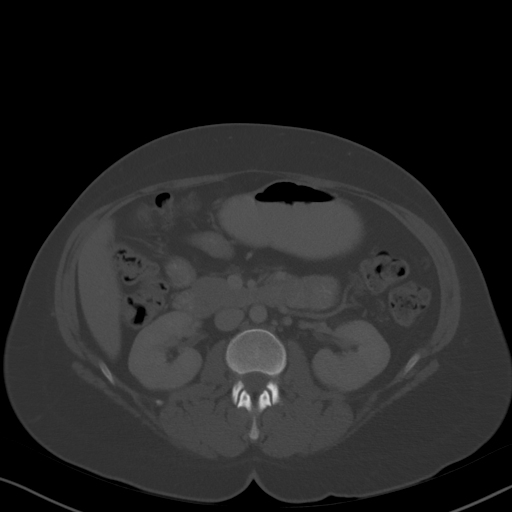
[im 61/86  soft-tissue]
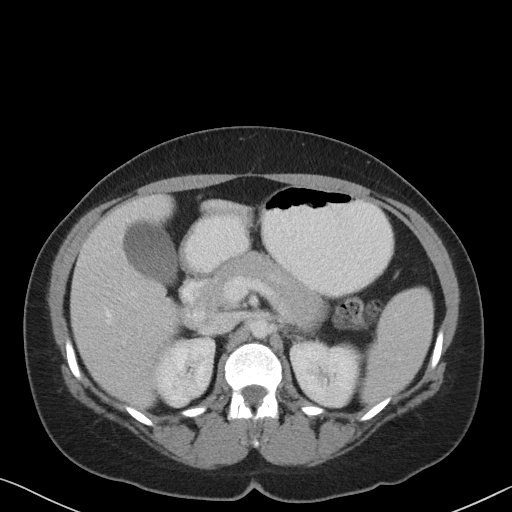
[im 69/86  soft-tissue]
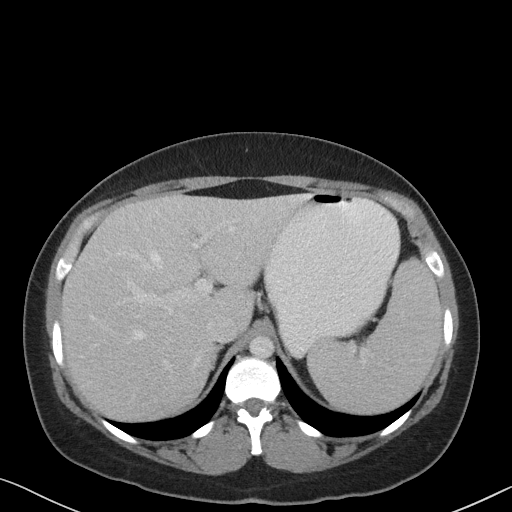
[im 75/86  soft-tissue]
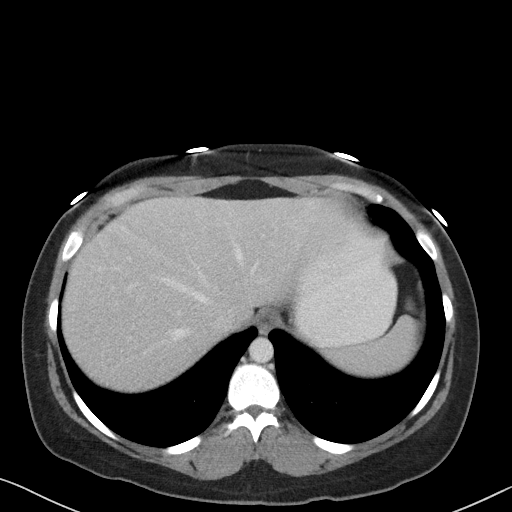
[im 80/86  soft-tissue]
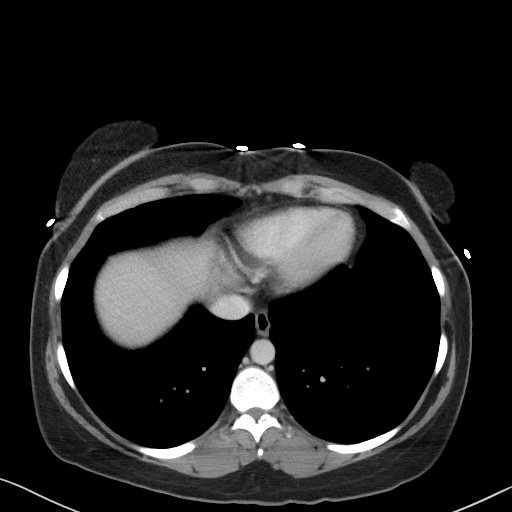

[Series 5: coronal st · coronal · 0.70mm/px · 3 of 97 slices shown]
[im 33/97  soft-tissue]
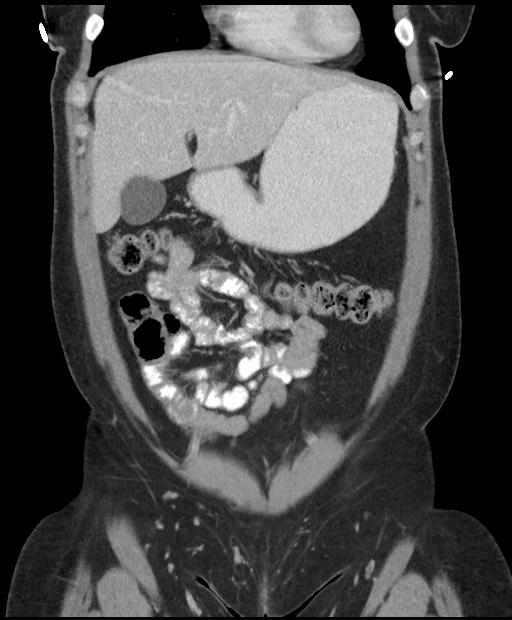
[im 43/97  soft-tissue]
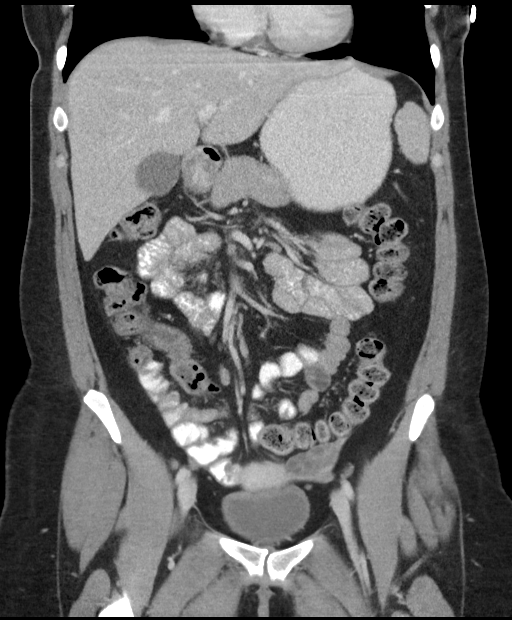
[im 54/97  soft-tissue]
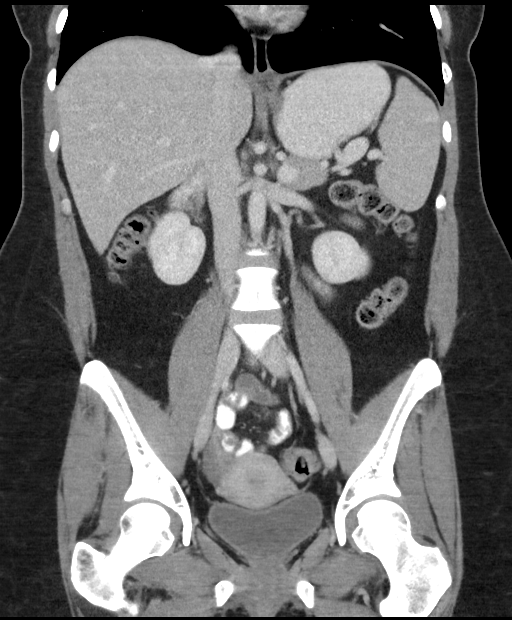

[16 of 46 positions shown; findings below may reference images not displayed]

FINDINGS: Lower chest: No acute abnormality.

Hepatobiliary: No focal liver abnormality is seen. No gallstones,
gallbladder wall thickening, or biliary dilatation.

Pancreas: Unremarkable. No pancreatic ductal dilatation or
surrounding inflammatory changes.

Spleen: Normal in size without focal abnormality.

Adrenals/Urinary Tract: Adrenal glands are unremarkable. Kidneys are
normal, without renal calculi, focal lesion, or hydronephrosis.
Bladder is unremarkable.

Stomach/Bowel: Stomach is within normal limits. Appendix appears
normal. No evidence of bowel wall thickening, distention, or
inflammatory changes.

Vascular/Lymphatic: Few nonspecific right lower quadrant mesenteric
lymph nodes. Right external iliac node measuring 5 mm short axis.

Reproductive: Uterus and bilateral adnexa are unremarkable.

Other: Minimal fat in the umbilicus.  No free air or free fluid

Musculoskeletal: No acute or significant osseous findings.
IMPRESSION: 1. Negative for acute appendicitis
2. A few small right lower quadrant mesenteric nodes are
questionable for an adenitis.

## 2018-12-17 DIAGNOSIS — F329 Major depressive disorder, single episode, unspecified: Secondary | ICD-10-CM | POA: Diagnosis not present

## 2018-12-17 DIAGNOSIS — F419 Anxiety disorder, unspecified: Secondary | ICD-10-CM | POA: Diagnosis not present

## 2018-12-17 DIAGNOSIS — Z6831 Body mass index (BMI) 31.0-31.9, adult: Secondary | ICD-10-CM | POA: Diagnosis not present

## 2018-12-17 DIAGNOSIS — R21 Rash and other nonspecific skin eruption: Secondary | ICD-10-CM | POA: Diagnosis not present

## 2019-01-11 NOTE — Telephone Encounter (Signed)
Note sent to nurse. 

## 2019-03-17 DIAGNOSIS — F419 Anxiety disorder, unspecified: Secondary | ICD-10-CM | POA: Diagnosis not present

## 2019-03-17 DIAGNOSIS — F329 Major depressive disorder, single episode, unspecified: Secondary | ICD-10-CM | POA: Diagnosis not present

## 2019-03-17 DIAGNOSIS — Z6831 Body mass index (BMI) 31.0-31.9, adult: Secondary | ICD-10-CM | POA: Diagnosis not present

## 2019-03-18 DIAGNOSIS — F329 Major depressive disorder, single episode, unspecified: Secondary | ICD-10-CM | POA: Diagnosis not present

## 2019-03-18 DIAGNOSIS — Z6830 Body mass index (BMI) 30.0-30.9, adult: Secondary | ICD-10-CM | POA: Diagnosis not present

## 2019-03-18 DIAGNOSIS — F419 Anxiety disorder, unspecified: Secondary | ICD-10-CM | POA: Diagnosis not present

## 2019-04-01 ENCOUNTER — Ambulatory Visit (INDEPENDENT_AMBULATORY_CARE_PROVIDER_SITE_OTHER): Payer: BLUE CROSS/BLUE SHIELD | Admitting: Psychiatry

## 2019-04-01 ENCOUNTER — Encounter (HOSPITAL_COMMUNITY): Payer: Self-pay | Admitting: Psychiatry

## 2019-04-01 ENCOUNTER — Other Ambulatory Visit: Payer: Self-pay

## 2019-04-01 DIAGNOSIS — F419 Anxiety disorder, unspecified: Secondary | ICD-10-CM | POA: Diagnosis not present

## 2019-04-01 DIAGNOSIS — F331 Major depressive disorder, recurrent, moderate: Secondary | ICD-10-CM

## 2019-04-01 NOTE — Progress Notes (Signed)
Psychiatric Initial Adult Assessment   Patient Identification: Tracey NatterJennifer R Reese MRN:  [id number] Date of Evaluation:  04/01/2019 Referral Source: primary care Chief Complaint:    Depression Visit Diagnosis:    ICD-10-CM   1. MDD (major depressive disorder), recurrent episode, moderate (HCC) F33.1   2. Anxiety disorder, unspecified type F41.9   I connected with Tracey Reese on 04/01/19 at  2:00 PM EDT by a video enabled telemedicine application and verified that I am speaking with the correct person using two identifiers.   I discussed the limitations of evaluation and management by telemedicine and the availability of in person appointments. The patient expressed understanding and agreed to proceed.  History of Present Illness: 33 years old currently married Caucasian female referred by primary care physician for management of depression.  She is also postpartum 9 months.  Around 2 weeks ago she has suffered from an episode of depression feeling hopelessness withdrawn she felt she had a terrible crying spells could not move was not able to function had to call parents to take care of the kids and then she consulted with the primary care physician her Wellbutrin at that time was increased to 300 mg.  She is doing better she feels 75% improved not crying every day her energy level depression and interest level has improved.  She was feeling anxious and some panicking couple of weeks ago she was stressed out because of recently moving and another home changing jobs last year says last year has been tough because of having a baby she has had postpartum before but this postpartum she has felt it has been more severe.  Does not endorse psychotic-like symptoms or manic-like symptoms with no associated paranoia or hearing voices to harm anybody she felt depressed few weeks ago and at that time she was feeling as of hopelessness but did not have any suicidal intent. She also takes Xanax which was  recently increased to 2 or 3 times a day very small dose of 0.5 mg states that she is only taking half and not taking 2 times a day she mostly takes it at nighttime it has helped anxiety she does not have any intention to keep on taking it regularly or more than once a day   no manic symptoms currently or in the past  Denies drug use.  Modifying factor marriage for the last 10 years parents.   3kids Aggravating factors; recently moved to another house change of jobs last year.  Postpartum  Duration more than for 5 years on and off  Severity of depression is 7 out of 10.  10 being no depression  Past history significant for depression during the second postpartum..  She has used Celexa before but apparently she she has had some side effects and now she is written as allergic to Celexa possible related to serotonin    Past Psychiatric History: depression, anxiety  Previous Psychotropic Medications: Yes   Substance Abuse History in the last 12 months:  No.  Consequences of Substance Abuse: NA  Past Medical History:  Past Medical History:  Diagnosis Date  . Anxiety   . Bleeding 11/17/2015  . Complication of anesthesia    difficulty placing epidural  . Constipation   . Depression   . Gestational diabetes    with first pregnancy  . History of pre-eclampsia in prior pregnancy, currently pregnant   . Hypertension    during second pregnancy  . Vaginal Pap smear, abnormal     Past  Surgical History:  Procedure Laterality Date  . CESAREAN SECTION  2012,2015  . CESAREAN SECTION WITH BILATERAL TUBAL LIGATION N/A 05/31/2018   Procedure: CESAREAN SECTION WITH BILATERAL TUBAL LIGATION;  Surgeon: Tilda Burrow, MD;  Location: [facility];  Service: Obstetrics;  Laterality: N/A;  . COLPOSCOPY  2006  . LEEP  2006  . TONSILLECTOMY AND ADENOIDECTOMY  1989  . WISDOM TOOTH EXTRACTION  2009    Family Psychiatric History: Father diagnosed with depression and PTSD grandmother  diagnosed with depression  Family History:  Family History  Problem Relation Age of Onset  . Cancer Mother        cervical  . Diabetes Mother        prediabetes  . Syncope episode Sister   . Hyperlipidemia Maternal Grandfather   . Other Maternal Grandfather        heart stents  . Parkinson's disease Paternal Grandmother   . Cancer Paternal Grandmother        colon    Social History:   Social History   Socioeconomic History  . Marital status: Married    Spouse name: Not on file  . Number of children: Not on file  . Years of education: Not on file  . Highest education level: Not on file  Occupational History  . Not on file  Social Needs  . Financial resource strain: Not on file  . Food insecurity:    Worry: Not on file    Inability: Not on file  . Transportation needs:    Medical: Not on file    Non-medical: Not on file  Tobacco Use  . Smoking status: Former Smoker    Types: Cigarettes  . Smokeless tobacco: Former Neurosurgeon    Types: Chew  . Tobacco comment: quit in 2011  Substance and Sexual Activity  . Alcohol use: No    Frequency: Never    Comment: occ  . Drug use: No  . Sexual activity: Yes    Birth control/protection: Surgical  Lifestyle  . Physical activity:    Days per week: Not on file    Minutes per session: Not on file  . Stress: Not on file  Relationships  . Social connections:    Talks on phone: Not on file    Gets together: Not on file    Attends religious service: Not on file    Active member of club or organization: Not on file    Attends meetings of clubs or organizations: Not on file    Relationship status: Not on file  Other Topics Concern  . Not on file  Social History Narrative  . Not on file    Additional Social History: Grew up with parents that was not there usually because he was in the [employer] or the [employer] for 24 years.  Mostly grew up with her mom had one sibling sister growing was good no trauma or abuse.  Married for 10  years  Allergies:   Allergies  Allergen Reactions  . Celexa [Citalopram] Other (See Comments)    Slurred speech; couldn't control body movements  . Orange Oil Rash  . Sulfa Antibiotics Rash    Metabolic Disorder Labs: Lab Results  Component Value Date   HGBA1C 5.2 11/30/2015   Lab Results  Component Value Date   PROLACTIN 12.7 11/30/2015   No results found for: CHOL, TRIG, HDL, CHOLHDL, VLDL, LDLCALC Lab Results  Component Value Date   TSH 1.590 11/30/2015    Therapeutic Level Labs:  No results found for: LITHIUM No results found for: CBMZ No results found for: VALPROATE  Current Medications: Current Outpatient Medications  Medication Sig Dispense Refill  . acetaminophen (TYLENOL) 325 MG tablet Take 650 mg by mouth every 6 (six) hours as needed for mild pain or headache.    . ALPRAZolam (XANAX) 0.25 MG tablet Take 0.125 mg by mouth at bedtime as needed for anxiety.    Marland Kitchen aspirin EC 81 MG tablet Take 81 mg by mouth daily.    Marland Kitchen buPROPion (WELLBUTRIN XL) 150 MG 24 hr tablet Take 150 mg by mouth daily.    Marland Kitchen docusate sodium (COLACE) 100 MG capsule Take 100 mg by mouth daily as needed for mild constipation.    Marland Kitchen ibuprofen (ADVIL,MOTRIN) 600 MG tablet Take 1 tablet (600 mg total) by mouth every 6 (six) hours. 30 tablet 0  . Prenatal Multivit-Min-Fe-FA (PRENATAL VITAMINS PO) Take 1 tablet by mouth daily.      No current facility-administered medications for this visit.     Musculoskeletal:   Psychiatric Specialty Exam: Review of Systems  Cardiovascular: Negative for chest pain.  Psychiatric/Behavioral: Negative for substance abuse and suicidal ideas.    currently breastfeeding.There is no height or weight on file to calculate BMI.  General Appearance: Casual  Eye Contact:  Fair  Speech:  Normal Rate  Volume:  Decreased  Mood:  Euthymic  Affect:  Congruent  Thought Process:  Goal Directed  Orientation:  Full (Time, Place, and Person)  Thought Content:  Logical   Suicidal Thoughts:  No  Homicidal Thoughts:  No  Memory:  Immediate;   Fair Recent;   Fair  Judgement:  Fair  Insight:  Fair  Psychomotor Activity:  Normal  Concentration:  Concentration: Fair and Attention Span: Fair  Recall:  [employer] of Knowledge:Good  Language: Fair  Akathisia:  No  Handed:  Right  AIMS (if indicated):  not done  Assets:  Desire for Improvement  ADL's:  Intact  Cognition: WNL  Sleep:  Fair   Screenings: PHQ2-9     Initial Prenatal from 11/06/2017 in [facility]  PHQ-2 Total Score  2  PHQ-9 Total Score  7      Assessment and Plan: as follows MDD recurrent moderate: postpartum specifier. Doing better since wellbutrin increased. Continue and have refill Anxiety unspecified: relavant to last year stressors, doing better. Has cut down xanax to one a day. Discussed option of effexor small dose if needed. For now she feels comfortable with small and low dose xanax  Also drinks sporadic but still cautioned to avoid and abstain alcohol while on meds and due to depression and family history  Depression improved, provided supportive therapy I discussed the assessment and treatment plan with the patient. The patient was provided an opportunity to ask questions and all were answered. The patient agreed with the plan and demonstrated an understanding of the instructions.   The patient was advised to call back or seek an in-person evaluation if the symptoms worsen or if the condition fails to improve as anticipated.  I provided 50 minutes of non-face-to-face time during this encounter.   Thresa Ross, MD 5/21/20202:37 PM

## 2019-05-04 ENCOUNTER — Ambulatory Visit (HOSPITAL_COMMUNITY): Payer: BLUE CROSS/BLUE SHIELD | Admitting: Psychiatry

## 2019-08-23 DIAGNOSIS — R21 Rash and other nonspecific skin eruption: Secondary | ICD-10-CM | POA: Diagnosis not present

## 2019-08-23 DIAGNOSIS — F419 Anxiety disorder, unspecified: Secondary | ICD-10-CM | POA: Diagnosis not present

## 2019-08-23 DIAGNOSIS — F329 Major depressive disorder, single episode, unspecified: Secondary | ICD-10-CM | POA: Diagnosis not present

## 2019-08-23 DIAGNOSIS — E781 Pure hyperglyceridemia: Secondary | ICD-10-CM | POA: Diagnosis not present

## 2019-08-23 DIAGNOSIS — Z6830 Body mass index (BMI) 30.0-30.9, adult: Secondary | ICD-10-CM | POA: Diagnosis not present

## 2019-08-23 DIAGNOSIS — Z23 Encounter for immunization: Secondary | ICD-10-CM | POA: Diagnosis not present

## 2019-12-28 ENCOUNTER — Telehealth (HOSPITAL_COMMUNITY): Payer: Self-pay

## 2019-12-28 NOTE — Telephone Encounter (Signed)
Pt made appt to see Dr. Gilmore Laroche

## 2019-12-29 ENCOUNTER — Other Ambulatory Visit: Payer: Self-pay

## 2019-12-29 ENCOUNTER — Ambulatory Visit (INDEPENDENT_AMBULATORY_CARE_PROVIDER_SITE_OTHER): Payer: BC Managed Care – PPO | Admitting: Psychiatry

## 2019-12-29 ENCOUNTER — Encounter (HOSPITAL_COMMUNITY): Payer: Self-pay | Admitting: Psychiatry

## 2019-12-29 DIAGNOSIS — F331 Major depressive disorder, recurrent, moderate: Secondary | ICD-10-CM | POA: Diagnosis not present

## 2019-12-29 DIAGNOSIS — F419 Anxiety disorder, unspecified: Secondary | ICD-10-CM

## 2019-12-29 DIAGNOSIS — F329 Major depressive disorder, single episode, unspecified: Secondary | ICD-10-CM | POA: Diagnosis not present

## 2019-12-29 DIAGNOSIS — Z6828 Body mass index (BMI) 28.0-28.9, adult: Secondary | ICD-10-CM | POA: Diagnosis not present

## 2019-12-29 DIAGNOSIS — E781 Pure hyperglyceridemia: Secondary | ICD-10-CM | POA: Diagnosis not present

## 2019-12-29 MED ORDER — LAMOTRIGINE 25 MG PO TABS
25.0000 mg | ORAL_TABLET | Freq: Every day | ORAL | 0 refills | Status: AC
Start: 2019-12-29 — End: ?

## 2019-12-29 NOTE — Progress Notes (Signed)
[facility] Follow up visit  Patient Identification: Tracey Reese MRN:  [id number] Date of Evaluation:  12/29/2019 Referral Source: primary care Chief Complaint:    Depression Visit Diagnosis:    ICD-10-CM   1. MDD (major depressive disorder), recurrent episode, moderate (HCC)  F33.1   2. Anxiety disorder, unspecified type  F41.9     I connected with Trilby Drummer on 12/29/19 at  2:00 PM EST by a video enabled telemedicine application and verified that I am speaking with the correct person using two identifiers. I discussed the limitations of evaluation and management by telemedicine and the availability of in person appointments. The patient expressed understanding and agreed to proceed.  History of Present Illness: 34  years old currently married Caucasian female referred by primary care physician for management of depression.  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  Modifying factor marriage for the last 10 years parents.   3 kids Aggravating factors; moved to another house.  Postpartum  Duration more then 5 years    Past history significant for depression during the second postpartum..  She has used Celexa before but apparently she she has had some side effects and now she is written as allergic to Celexa possible related to serotonin    Past Psychiatric History: depression, anxiety  Previous Psychotropic Medications: Yes   Substance Abuse History in the last 12 months:  No.  Consequences of Substance Abuse: NA  Past Medical History:  Past Medical History:  Diagnosis Date  . Anxiety   . Bleeding 11/17/2015  . Complication of  anesthesia    difficulty placing epidural  . Constipation   . Depression   . Gestational diabetes    with first pregnancy  . History of pre-eclampsia in prior pregnancy, currently pregnant   . Hypertension    during second pregnancy  . Vaginal Pap smear, abnormal     Past Surgical History:  Procedure Laterality Date  . CESAREAN SECTION  2012,2015  . CESAREAN SECTION WITH BILATERAL TUBAL LIGATION N/A 05/31/2018   Procedure: CESAREAN SECTION WITH BILATERAL TUBAL LIGATION;  Surgeon: Jonnie Kind, MD;  Location: [facility];  Service: Obstetrics;  Laterality: N/A;  . COLPOSCOPY  2006  . LEEP  2006  . TONSILLECTOMY AND ADENOIDECTOMY  1989  . WISDOM TOOTH EXTRACTION  2009      Family History:  Family History  Problem Relation Age of Onset  . Cancer Mother        cervical  . Diabetes Mother        prediabetes  . Syncope episode Sister   . Hyperlipidemia Maternal Grandfather   . Other Maternal Grandfather        heart stents  . Parkinson's disease Paternal Grandmother   . Cancer Paternal Grandmother        colon    Social History:   Social History   Socioeconomic History  . Marital status: Married    Spouse name: Not on file  . Number of children: Not on file  . Years of education: Not on file  . Highest education level: Not on file  Occupational  History  . Not on file  Tobacco Use  . Smoking status: Former Smoker    Types: Cigarettes  . Smokeless tobacco: Former Neurosurgeon    Types: Chew  . Tobacco comment: quit in 2011  Substance and Sexual Activity  . Alcohol use: No    Comment: occ  . Drug use: No  . Sexual activity: Yes    Birth control/protection: Surgical  Other Topics Concern  . Not on file  Social History Narrative  . Not on file   Social Determinants of Health   Financial Resource Strain:   . Difficulty of Paying Living Expenses: Not on file  Food Insecurity:   . Worried About Programme researcher, broadcasting/film/video in the Last Year: Not on file  . Ran Out  of Food in the Last Year: Not on file  Transportation Needs:   . Lack of Transportation (Medical): Not on file  . Lack of Transportation (Non-Medical): Not on file  Physical Activity:   . Days of Exercise per Week: Not on file  . Minutes of Exercise per Session: Not on file  Stress:   . Feeling of Stress : Not on file  Social Connections:   . Frequency of Communication with Friends and Family: Not on file  . Frequency of Social Gatherings with Friends and Family: Not on file  . Attends Religious Services: Not on file  . Active Member of Clubs or Organizations: Not on file  . Attends Banker Meetings: Not on file  . Marital Status: Not on file      Allergies:   Allergies  Allergen Reactions  . Celexa [Citalopram] Other (See Comments)    Slurred speech; couldn't control body movements  . Orange Oil Rash  . Sulfa Antibiotics Rash    Metabolic Disorder Labs: Lab Results  Component Value Date   HGBA1C 5.2 11/30/2015   Lab Results  Component Value Date   PROLACTIN 12.7 11/30/2015   No results found for: CHOL, TRIG, HDL, CHOLHDL, VLDL, LDLCALC Lab Results  Component Value Date   TSH 1.590 11/30/2015    Therapeutic Level Labs: No results found for: LITHIUM No results found for: CBMZ No results found for: VALPROATE  Current Medications: Current Outpatient Medications  Medication Sig Dispense Refill  . acetaminophen (TYLENOL) 325 MG tablet Take 650 mg by mouth every 6 (six) hours as needed for mild pain or headache.    . ALPRAZolam (XANAX) 0.25 MG tablet Take 0.125 mg by mouth at bedtime as needed for anxiety.    Marland Kitchen aspirin EC 81 MG tablet Take 81 mg by mouth daily.    Marland Kitchen buPROPion (WELLBUTRIN XL) 150 MG 24 hr tablet Take 150 mg by mouth daily.    Marland Kitchen docusate sodium (COLACE) 100 MG capsule Take 100 mg by mouth daily as needed for mild constipation.    Marland Kitchen ibuprofen (ADVIL,MOTRIN) 600 MG tablet Take 1 tablet (600 mg total) by mouth every 6 (six) hours. 30 tablet  0  . lamoTRIgine (LAMICTAL) 25 MG tablet Take 1 tablet (25 mg total) by mouth daily. Take one tablet daily for a week and then start taking 2 tablets. 60 tablet 0  . Prenatal Multivit-Min-Fe-FA (PRENATAL VITAMINS PO) Take 1 tablet by mouth daily.      No current facility-administered medications for this visit.    Musculoskeletal:   Psychiatric Specialty Exam: Review of Systems  Cardiovascular: Negative for chest pain.  Psychiatric/Behavioral: Negative for substance abuse and suicidal ideas.    currently breastfeeding.There is  no height or weight on file to calculate BMI.  General Appearance: Casual  Eye Contact:  Fair  Speech:  Normal Rate  Volume:  Decreased  Mood:  Fair somewhat stressed  Affect:  Congruent  Thought Process:  Goal Directed  Orientation:  Full (Time, Place, and Person)  Thought Content:  Logical  Suicidal Thoughts:  No  Homicidal Thoughts:  No  Memory:  Immediate;   Fair Recent;   Fair  Judgement:  Fair  Insight:  Fair  Psychomotor Activity:  Normal  Concentration:  Concentration: Fair and Attention Span: Fair  Recall:  [employer] of Knowledge:Good  Language: Fair  Akathisia:  No  Handed:  Right  AIMS (if indicated):  not done  Assets:  Desire for Improvement  ADL's:  Intact  Cognition: WNL  Sleep:  Fair   Screenings: PHQ2-9     Initial Prenatal from 11/06/2017 in [facility]  PHQ-2 Total Score  2  PHQ-9 Total Score  7      Assessment and Plan: as follows MDD recurrent moderate: postpartum specifier. Some recurrence of depression alternating with elevated mood days as well. Add lamictal 25mg  increase to 50mg  in one week, continue wellbutrin Discussed rash  Anxiety unspecified: fluctuates, takes xanax prn can continue . lexapro did not help before  Can start effexor if needed, for now more concern of mood symptoms  provided supportive therapy I discussed the assessment and treatment plan with the patient. The patient was provided an  opportunity to ask questions and all were answered. The patient agreed with the plan and demonstrated an understanding of the instructions.   The patient was advised to call back or seek an in-person evaluation if the symptoms worsen or if the condition fails to improve as anticipated.  Fu 3 weeks discussed compliance with follow ups States bonding is well with kids   , MD 2/17/20212:11 PM

## 2019-12-30 ENCOUNTER — Ambulatory Visit (HOSPITAL_COMMUNITY): Payer: BC Managed Care – PPO | Admitting: Psychiatry

## 2020-01-17 DIAGNOSIS — F419 Anxiety disorder, unspecified: Secondary | ICD-10-CM | POA: Diagnosis not present

## 2020-01-17 DIAGNOSIS — F329 Major depressive disorder, single episode, unspecified: Secondary | ICD-10-CM | POA: Diagnosis not present

## 2020-01-17 DIAGNOSIS — Z6828 Body mass index (BMI) 28.0-28.9, adult: Secondary | ICD-10-CM | POA: Diagnosis not present

## 2020-01-20 ENCOUNTER — Ambulatory Visit (HOSPITAL_COMMUNITY): Payer: BC Managed Care – PPO | Admitting: Psychiatry

## 2020-02-25 DIAGNOSIS — R0789 Other chest pain: Secondary | ICD-10-CM | POA: Diagnosis not present

## 2020-02-25 DIAGNOSIS — R002 Palpitations: Secondary | ICD-10-CM | POA: Diagnosis not present

## 2020-03-15 DIAGNOSIS — R002 Palpitations: Secondary | ICD-10-CM | POA: Diagnosis not present

## 2020-03-15 DIAGNOSIS — R0789 Other chest pain: Secondary | ICD-10-CM | POA: Diagnosis not present

## 2020-04-18 DIAGNOSIS — R002 Palpitations: Secondary | ICD-10-CM | POA: Diagnosis not present

## 2020-04-18 DIAGNOSIS — R0789 Other chest pain: Secondary | ICD-10-CM | POA: Diagnosis not present

## 2020-05-17 DIAGNOSIS — F419 Anxiety disorder, unspecified: Secondary | ICD-10-CM | POA: Diagnosis not present

## 2020-05-17 DIAGNOSIS — F329 Major depressive disorder, single episode, unspecified: Secondary | ICD-10-CM | POA: Diagnosis not present

## 2020-05-17 DIAGNOSIS — Z6831 Body mass index (BMI) 31.0-31.9, adult: Secondary | ICD-10-CM | POA: Diagnosis not present

## 2020-09-27 DIAGNOSIS — Z23 Encounter for immunization: Secondary | ICD-10-CM | POA: Diagnosis not present

## 2020-09-27 DIAGNOSIS — F329 Major depressive disorder, single episode, unspecified: Secondary | ICD-10-CM | POA: Diagnosis not present

## 2020-09-27 DIAGNOSIS — Z0000 Encounter for general adult medical examination without abnormal findings: Secondary | ICD-10-CM | POA: Diagnosis not present

## 2020-09-27 DIAGNOSIS — F419 Anxiety disorder, unspecified: Secondary | ICD-10-CM | POA: Diagnosis not present

## 2021-03-07 ENCOUNTER — Telehealth: Payer: Self-pay | Admitting: Obstetrics & Gynecology

## 2021-03-07 NOTE — Telephone Encounter (Signed)
Patient called stating that she had a tubal done 2019 and she had some imaging done and it is showing she only has one clamp. Pleas contact

## 2021-07-26 ENCOUNTER — Encounter: Payer: Self-pay | Admitting: Emergency Medicine

## 2021-07-26 ENCOUNTER — Emergency Department: Admit: 2021-07-26 | Discharge status: Absent without leave | Payer: Self-pay

## 2021-07-26 ENCOUNTER — Emergency Department (INDEPENDENT_AMBULATORY_CARE_PROVIDER_SITE_OTHER)
Admission: EM | Admit: 2021-07-26 | Discharge: 2021-07-26 | Disposition: A | Discharge status: Home / Self Care | Payer: BC Managed Care – PPO | Source: Home / Self Care

## 2021-07-26 ENCOUNTER — Other Ambulatory Visit: Payer: Self-pay

## 2021-07-26 DIAGNOSIS — S0502XA Injury of conjunctiva and corneal abrasion without foreign body, left eye, initial encounter: Secondary | ICD-10-CM | POA: Diagnosis not present

## 2021-07-26 MED ORDER — ERYTHROMYCIN 5 MG/GM OP OINT: TOPICAL_OINTMENT | Freq: Once | OPHTHALMIC | Status: AC
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
## 2021-07-26 NOTE — Discharge Instructions (Addendum)
Use eye ointment 3 x a day May also use a liquid tear lubricant drip or gel Take tylenol or ad il or aleve for pain  See eye specialty if fails to improve as expected
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
